# Patient Record
Sex: Male | Born: 1965
Health system: Southern US, Community
[De-identification: ages and names within clinical notes are randomized; demographics above are authoritative.]

## PROBLEM LIST (undated history)

## (undated) DIAGNOSIS — R635 Abnormal weight gain: Secondary | ICD-10-CM

## (undated) DIAGNOSIS — R6882 Decreased libido: Secondary | ICD-10-CM

## (undated) DIAGNOSIS — E785 Hyperlipidemia, unspecified: Secondary | ICD-10-CM

## (undated) DIAGNOSIS — G4733 Obstructive sleep apnea (adult) (pediatric): Secondary | ICD-10-CM

## (undated) DIAGNOSIS — M545 Low back pain, unspecified: Secondary | ICD-10-CM

## (undated) DIAGNOSIS — K219 Gastro-esophageal reflux disease without esophagitis: Secondary | ICD-10-CM

## (undated) DIAGNOSIS — I10 Essential (primary) hypertension: Secondary | ICD-10-CM

## (undated) DIAGNOSIS — R5383 Other fatigue: Secondary | ICD-10-CM

## (undated) DIAGNOSIS — J309 Allergic rhinitis, unspecified: Secondary | ICD-10-CM

## (undated) HISTORY — DX: Abnormal weight gain: R63.5

## (undated) HISTORY — PX: OTHER SURGICAL HISTORY: SHX169

## (undated) HISTORY — DX: Obstructive sleep apnea (adult) (pediatric): G47.33

## (undated) HISTORY — DX: Hyperlipidemia, unspecified: E78.5

## (undated) HISTORY — DX: Allergic rhinitis, unspecified: J30.9

## (undated) HISTORY — DX: Low back pain: M54.5

## (undated) HISTORY — DX: Decreased libido: R68.82

## (undated) HISTORY — DX: Gastro-esophageal reflux disease without esophagitis: K21.9

## (undated) HISTORY — DX: Other fatigue: R53.83

## (undated) HISTORY — DX: Low back pain, unspecified: M54.50

---

## 2004-03-22 ENCOUNTER — Emergency Department (HOSPITAL_COMMUNITY): Admission: EM | Admit: 2004-03-22 | Discharge: 2004-03-22 | Payer: Self-pay | Admitting: Emergency Medicine

## 2006-06-20 ENCOUNTER — Ambulatory Visit (HOSPITAL_BASED_OUTPATIENT_CLINIC_OR_DEPARTMENT_OTHER): Admission: RE | Admit: 2006-06-20 | Discharge: 2006-06-20 | Payer: Self-pay | Admitting: Otolaryngology

## 2006-06-25 ENCOUNTER — Ambulatory Visit: Payer: Self-pay | Admitting: Internal Medicine

## 2008-08-17 ENCOUNTER — Emergency Department (HOSPITAL_COMMUNITY): Admission: EM | Admit: 2008-08-17 | Discharge: 2008-08-17 | Payer: Self-pay | Admitting: Emergency Medicine

## 2010-11-15 LAB — COMPREHENSIVE METABOLIC PANEL
ALT: 18 U/L (ref 0–53)
AST: 17 U/L (ref 0–37)
Albumin: 3.9 g/dL (ref 3.5–5.2)
Alkaline Phosphatase: 58 U/L (ref 39–117)
BUN: 13 mg/dL (ref 6–23)
CO2: 27 mEq/L (ref 19–32)
Calcium: 8.6 mg/dL (ref 8.4–10.5)
Chloride: 100 mEq/L (ref 96–112)
Creatinine, Ser: 1.23 mg/dL (ref 0.4–1.5)
GFR calc Af Amer: >60 mL/min (ref >60)
GFR calc non Af Amer: >60 mL/min (ref >60)
Glucose, Bld: 92 mg/dL (ref 70–99)
Potassium: 3.6 mEq/L (ref 3.5–5.1)
Sodium: 134 mEq/L — ABNORMAL LOW (ref 135–145)
Total Bilirubin: 1.2 mg/dL (ref 0.3–1.2)
Total Protein: 6.6 g/dL (ref 6.0–8.3)

## 2010-11-15 LAB — LIPASE, BLOOD: Lipase: 27 U/L (ref 11–59)

## 2010-11-15 LAB — POCT CARDIAC MARKERS
CKMB, poc: <1 ng/mL — ABNORMAL LOW (ref 1.0–8.0)
CKMB, poc: <1 ng/mL — ABNORMAL LOW (ref 1.0–8.0)
Myoglobin, poc: 126 ng/mL (ref 12–200)
Myoglobin, poc: 193 ng/mL (ref 12–200)
Troponin i, poc: <0.05 ng/mL (ref 0.00–0.09)
Troponin i, poc: <0.05 ng/mL (ref 0.00–0.09)

## 2010-12-17 NOTE — Procedures (Signed)
NAME:  Kyle Jacobson, Kyle Jacobson NO.:  [id number]   MEDICAL RECORD NO.:  [id number]          PATIENT TYPE:  OUT   LOCATION:  SLEEP CENTER                 FACILITY:  [facility]   PHYSICIAN:  Clinton D. Maple Hudson, MD, FCCP, FACPDATE OF BIRTH:  1964-09-03   DATE OF STUDY:                              NOCTURNAL POLYSOMNOGRAM   REFERRING PHYSICIAN:  Dr. Dillard Cannon   INDICATION FOR STUDY:  Hypersomnia with sleep apnea.   EPWORTH SLEEPINESS SCORE:  14/24, BMI 30.7, weight 215 pounds.   MEDICATIONS:  Chantix.   SLEEP ARCHITECTURE:  Total sleep time 387 minutes with sleep efficiency 94%.  Stage I was 5%, stage II 71%, stages III and IV 1%, REM 23% of total sleep  time. Sleep latency 14 minutes, REM latency 15 minutes. Awake after sleep  onset 10 minutes. Arousal index 16.4 which is slightly elevated. No bedtime  medication was taken.   RESPIRATORY DATA:  Split study protocol. Apnea-hypopnea index (AHI, RDI) 38%  obstructive events per hour indicating moderate obstructive sleep  apnea/hypopnea syndrome before CPAP. This included 57 obstructive apneas and  24 hypopneas before CPAP. Events were strongly positional, almost  exclusively recorded while sleeping supine. REM AHI 23.3/hour. CPAP was  titrated successfully to 15 CWP, AHI 0.6/hour. A medium ResMed Ultra Mirage  full face mask was used with a heated humidifier.   OXYGEN DATA:  Moderate snoring with oxygen desaturation to a nadir of 77%  before CPAP. After CPAP control saturation held 97% on room air.   CARDIAC DATA:  Normal sinus rhythm.   MOVEMENT-PARASOMNIA:  Occasional limb jerk with insignificant impact x1.  Bathroom x1.   IMPRESSIONS-RECOMMENDATIONS:  1. Unremarkable sleep architecture for the sleep center environment.  2. Moderate obstructive sleep apnea/hypopnea syndrome, apnea-hypopnea      index 38.3/hour with events positional, mostly noted while sleeping      supine. Moderate snoring with oxygen desaturation  to a nadir of 77%.  3. Successful continuous positive airway pressure titration to 15 cm water      pressure (CWP), apnea-hypopnea index 0.6 per hour. The medium ResMed      Ultra Mirage full face mask was used with a heated humidifier.      Clinton D. Maple Hudson, MD, [facility], FACP  Diplomate, Biomedical engineer of Sleep Medicine  Electronically Signed     CDY/MEDQ  D:  06/25/2006 16:40:19  T:  06/25/2006 17:05:56  Job:  [id number]

## 2014-06-18 ENCOUNTER — Telehealth: Payer: Self-pay | Admitting: Neurology

## 2014-06-18 NOTE — Telephone Encounter (Signed)
Dr. Moreen Fowler referring provider was notified of pt r/s his appt from 06/20/14 to 08/13/13

## 2014-06-20 ENCOUNTER — Ambulatory Visit: Payer: Self-pay | Admitting: Neurology

## 2014-08-12 ENCOUNTER — Encounter: Payer: Self-pay | Admitting: *Deleted

## 2014-08-13 ENCOUNTER — Encounter: Payer: Self-pay | Admitting: Neurology

## 2014-08-13 ENCOUNTER — Ambulatory Visit (INDEPENDENT_AMBULATORY_CARE_PROVIDER_SITE_OTHER): Payer: BLUE CROSS/BLUE SHIELD | Admitting: Neurology

## 2014-08-13 VITALS — BP 124/86 | HR 73 | Ht 70.0 in | Wt 241.2 lb

## 2014-08-13 DIAGNOSIS — R209 Unspecified disturbances of skin sensation: Secondary | ICD-10-CM

## 2014-08-13 NOTE — Progress Notes (Signed)
Note faxed and Epic updated.

## 2014-08-13 NOTE — Patient Instructions (Signed)
1.  Stay well-hydrated 2.  If you develop new numbness or tingling, please call my office to schedule an appointment  3.  Return to clinic as needed

## 2014-08-13 NOTE — Progress Notes (Signed)
[facility] Clinic Note - Initial Visit   Date: 08/13/2014:  Kyle Jacobson MRN: [id number] DOB: [birth year]   Dear Dr:  Moreen Fowler:   Thank you for your kind referral of Kyle Jacobson for consultation of paresthesias of the hands and feet. Although his history is well known to you, please allow [place] to reiterate it for the purpose of our medical record. The patient was accompanied to the clinic by self.     History of Present Illness: Kyle Jacobson is a 49 y.o. right-handed African American male with GERD, OSA, hyperlipidemia, and low back pain presenting for evaluation of discomfort of the hands and feet.  Starting around September 2015, he gradually noticed discomfort of the fingertips.  He has a hard time describing it, but sensation it "doesnt feel right, more soreness".  He denies tingling/numbness of the hands or feet, as if they are asleep.  It occurs 2-3 times per week for about 15 minutes.  He has not noticed any triggers.  No exacerbating factors, but he has noticed that drinking water helps.  He does not have neck discomfort, weakness, or joint pain.    He was seen at urgent care center on 05/01/2014 where he had laboratory testing BMP, TSH free T4, vitamin B12, CBC, and folate which was normal.  He has follow-up visit with his PCP who reassured him that his symptoms did not seem to be due to a worrisome disorder, however patient requested a neurological consultation due to on going nature of his symptoms.  Since October, his symptoms have somewhat improved.    He denies any significant stress which could be contributing to his presentation.  Past Medical History  Diagnosis Date  . GERD (gastroesophageal reflux disease)   . OSA (obstructive sleep apnea)   . Hyperlipidemia   . Low back pain   . Allergic rhinitis   . Fatigue   . Weight gain   . Libido, decreased     Past Surgical History  Procedure Laterality Date  . None       Medications:    No current outpatient prescriptions on file prior to visit.   No current facility-administered medications on file prior to visit.    Allergies: No Known Allergies  Family History: Family History  Problem Relation Age of Onset  . Other Father     unknown  . Healthy Mother   . Hypertension Brother   . Glaucoma Brother   . Cataracts Brother   . Healthy Daughter     x2    Social History: History   Social History  . Marital Status: Single    Spouse Name: N/A    Number of Children: N/A  . Years of Education: N/A   Occupational History  . Not on file.   Social History Main Topics  . Smoking status: Former Smoker -- 0.25 packs/day for 20 years    Types: Cigarettes    Quit date: 08/12/2005  . Smokeless tobacco: Not on file  . Alcohol Use: 0.0 oz/week    0 Not specified per week     Comment: Ocassionally  . Drug Use: No  . Sexual Activity: Not on file   Other Topics Concern  . Not on file   Social History Narrative   Patient is married with 2 children.  He work as a Administrator.     Lives in a 2 story home.     Education: high school.      Review of Systems:  CONSTITUTIONAL: No fevers, chills, night sweats, or weight loss.   EYES: No visual changes or eye pain ENT: No hearing changes.  No history of nose bleeds.   RESPIRATORY: No cough, wheezing and shortness of breath.   CARDIOVASCULAR: Negative for chest pain, and palpitations.   GI: Negative for abdominal discomfort, blood in stools or black stools.  No recent change in bowel habits.   GU:  No history of incontinence.   MUSCLOSKELETAL: No history of joint pain or swelling.  No myalgias.   SKIN: Negative for lesions, rash, and itching.   HEMATOLOGY/ONCOLOGY: Negative for prolonged bleeding, bruising easily, and swollen nodes.  No history of cancer.   ENDOCRINE: Negative for cold or heat intolerance, polydipsia or goiter.   PSYCH:  No depression or anxiety symptoms.   NEURO: As Above.   Vital Signs:  BP  124/86 mmHg  Pulse 73  Ht 5\' 10"  (1.778 m)  Wt 241 lb 4 oz (109.43 kg)  BMI 34.62 kg/m2  SpO2 97% Pain Scale: 0 on a scale of 0-10   General Medical Exam:   General:  Well appearing, comfortable.   Eyes/ENT: see cranial nerve examination.   Neck: No masses appreciated.  Full range of motion without tenderness.  No carotid bruits. Respiratory:  Clear to auscultation, good air entry bilaterally.   Cardiac:  Regular rate and rhythm, no murmur.   Extremities:  No deformities, edema, or skin discoloration.  Skin:  No rashes or lesions.  Neurological Exam: MENTAL STATUS including orientation to time, place, person, recent and remote memory, attention span and concentration, language, and fund of knowledge is normal.  Speech is not dysarthric.  CRANIAL NERVES: II:  No visual field defects.  Unremarkable fundi.   III-IV-VI: Pupils equal round and reactive to light.  Normal conjugate, extra-ocular eye movements in all directions of gaze.  No nystagmus.  No ptosis.   V:  Normal facial sensation.     VII:  Normal facial symmetry and movements.  No pathologic facial reflexes.  VIII:  Normal hearing and vestibular function.   IX-X:  Normal palatal movement.   XI:  Normal shoulder shrug and head rotation.   XII:  Normal tongue strength and range of motion, no deviation or fasciculation.  MOTOR:  No atrophy, fasciculations or abnormal movements.  No pronator drift.  Tone is normal.    Right Upper Extremity:    Left Upper Extremity:    Deltoid  5/5   Deltoid  5/5   Biceps  5/5   Biceps  5/5   Triceps  5/5   Triceps  5/5   Wrist extensors  5/5   Wrist extensors  5/5   Wrist flexors  5/5   Wrist flexors  5/5   Finger extensors  5/5   Finger extensors  5/5   Finger flexors  5/5   Finger flexors  5/5   Dorsal interossei  5/5   Dorsal interossei  5/5   Abductor pollicis  5/5   Abductor pollicis  5/5   Tone (Ashworth scale)  0  Tone (Ashworth scale)  0   Right Lower Extremity:    Left Lower  Extremity:    Hip flexors  5/5   Hip flexors  5/5   Hip extensors  5/5   Hip extensors  5/5   Knee flexors  5/5   Knee flexors  5/5   Knee extensors  5/5   Knee extensors  5/5   Dorsiflexors  5/5   Dorsiflexors  5/5   Plantarflexors  5/5   Plantarflexors  5/5   Toe extensors  5/5   Toe extensors  5/5   Toe flexors  5/5   Toe flexors  5/5   Tone (Ashworth scale)  0  Tone (Ashworth scale)  0   MSRs:  Right                                                                 Left brachioradialis 2+  brachioradialis 2+  biceps 2+  biceps 2+  triceps 2+  triceps 2+  patellar 2+  patellar 2+  ankle jerk 2+  ankle jerk 2+  Hoffman no  Hoffman no  plantar response down  plantar response down   SENSORY:  Normal and symmetric perception of light touch, pinprick, vibration, and proprioception.  Romberg's sign absent.   COORDINATION/GAIT: Normal finger-to- nose-finger and heel-to-shin.  Intact rapid alternating movements bilaterally.  Able to rise from a chair without using arms.  Gait narrow based and stable. Tandem and stressed gait intact.    IMPRESSION/PLAN: Mr. Holzhauer is a 49 year-old gentleman presenting for evaluation of intermittent soreness involving the toes and fingertips. His exam is non-focal and non-lateralizing.  I counseled the patient that with his normal exam and absence of numbness/tingling, my suspicion for a worrisome neurological condition is very low.  If his symptoms worsen, electrodiagnostic testing can be performed, but at this point, it is too premature and would most likely return normal.  Since symptoms are improved with hydration, I encouraged him to stay well hydrated. No further testing indicated at this time. Return to clinic as needed.   The duration of this appointment visit was 30 minutes of face-to-face time with the patient.  Greater than 50% of this time was spent in counseling, explanation of diagnosis, planning of further management, and coordination of  care.   Thank you for allowing me to participate in patient's care.  If I can answer any additional questions, I would be pleased to do so.    Sincerely,    Donika K. Posey Pronto, DO

## 2015-04-24 ENCOUNTER — Other Ambulatory Visit: Payer: Self-pay | Admitting: Family Medicine

## 2015-04-24 DIAGNOSIS — N50819 Testicular pain, unspecified: Secondary | ICD-10-CM

## 2015-04-27 ENCOUNTER — Ambulatory Visit
Admission: RE | Admit: 2015-04-27 | Discharge: 2015-04-27 | Disposition: A | Discharge status: Home / Self Care | Payer: BLUE CROSS/BLUE SHIELD | Source: Ambulatory Visit | Attending: Family Medicine | Admitting: Family Medicine

## 2015-04-27 DIAGNOSIS — N50819 Testicular pain, unspecified: Secondary | ICD-10-CM

## 2015-09-01 ENCOUNTER — Other Ambulatory Visit: Payer: Self-pay | Admitting: Family Medicine

## 2015-09-01 ENCOUNTER — Ambulatory Visit
Admission: RE | Admit: 2015-09-01 | Discharge: 2015-09-01 | Disposition: A | Discharge status: Home / Self Care | Payer: BLUE CROSS/BLUE SHIELD | Source: Ambulatory Visit | Attending: Family Medicine | Admitting: Family Medicine

## 2015-09-01 DIAGNOSIS — M25461 Effusion, right knee: Secondary | ICD-10-CM

## 2015-09-15 ENCOUNTER — Emergency Department
Admission: EM | Admit: 2015-09-15 | Discharge: 2015-09-15 | Disposition: A | Discharge status: Home / Self Care | Payer: BLUE CROSS/BLUE SHIELD | Source: Home / Self Care | Attending: Family Medicine | Admitting: Family Medicine

## 2015-09-15 ENCOUNTER — Encounter: Payer: Self-pay | Admitting: *Deleted

## 2015-09-15 DIAGNOSIS — R198 Other specified symptoms and signs involving the digestive system and abdomen: Secondary | ICD-10-CM

## 2015-09-15 MED ORDER — ESOMEPRAZOLE MAGNESIUM 40 MG PO PACK: 40.0000 mg | PACK | Freq: Every day | ORAL | Status: DC

## 2015-09-15 NOTE — ED Notes (Signed)
Pt c/o 3 days of intermittent epigastric pain. Pain is relieved by eating and drinking. Taken pepto otc. H/o reflux and gastric ulcer.

## 2015-09-15 NOTE — Discharge Instructions (Signed)

## 2015-09-15 NOTE — ED Provider Notes (Signed)
CSN: [id number]     Arrival date & time 09/15/15  1325 History   First MD Initiated Contact with Patient 09/15/15 1352     Chief Complaint  Patient presents with  . Abdominal Pain      HPI Comments: Patient complains of three day history of intermittent burning epigastric pain.  The pain is relieved by eating/drinking, and recurs about 2 hours after eating.  He has had nausea without vomiting.  He states that he had ulcers at age 50, and was diagnosed with reflux about 2 years ago.  He had similar symptoms about 10 years ago that resolved with Nexium.  Patient is a 50 y.o. male presenting with abdominal pain. The history is provided by the patient.  Abdominal Pain Pain location:  Epigastric Pain quality: burning and gnawing   Pain radiates to:  Does not radiate Pain severity:  Mild Onset quality:  Gradual Duration:  3 days Timing:  Intermittent Progression:  Worsening Chronicity:  Recurrent Context: eating   Context: not awakening from sleep and not diet changes   Relieved by: Pepto Bismol. Exacerbated by: not eating. Ineffective treatments:  None tried Associated symptoms: nausea   Associated symptoms: no anorexia, no belching, no chest pain, no constipation, no diarrhea, no fatigue, no fever, no hematemesis, no hematochezia, no melena and no vomiting     Past Medical History  Diagnosis Date  . GERD (gastroesophageal reflux disease)   . OSA (obstructive sleep apnea)   . Hyperlipidemia   . Low back pain   . Allergic rhinitis   . Fatigue   . Weight gain   . Libido, decreased    Past Surgical History  Procedure Laterality Date  . None     Family History  Problem Relation Age of Onset  . Other Father     unknown  . Healthy Mother   . Hypertension Brother   . Glaucoma Brother   . Cataracts Brother   . Healthy Daughter     x2   Social History  Substance Use Topics  . Smoking status: Former Smoker -- 0.25 packs/day for 20 years    Types: Cigarettes    Quit date:  08/12/2005  . Smokeless tobacco: Never Used  . Alcohol Use: 0.0 oz/week    0 Standard drinks or equivalent per week     Comment: Ocassionally    Review of Systems  Constitutional: Negative for fever and fatigue.  Cardiovascular: Negative for chest pain.  Gastrointestinal: Positive for nausea and abdominal pain. Negative for vomiting, diarrhea, constipation, melena, hematochezia, anorexia and hematemesis.  All other systems reviewed and are negative.   Allergies  Review of patient's allergies indicates no known allergies.  Home Medications   Prior to Admission medications   Medication Sig Start Date End Date Taking? Authorizing Provider  meloxicam (MOBIC) 15 MG tablet Take 15 mg by mouth daily.   Yes Historical Provider, MD  esomeprazole (NEXIUM) 40 MG packet Take 40 mg by mouth daily before breakfast. 09/15/15   Kandra Nicolas, MD   Meds Ordered and Administered this Visit  Medications - No data to display  BP 130/89 mmHg  Pulse 64  Temp(Src) 98.5 F (36.9 C) (Oral)  Resp 14  Ht 5\' 10"  (1.778 m)  Wt 238 lb (107.956 kg)  BMI 34.15 kg/m2  SpO2 99% No data found.   Physical Exam Nursing notes and Vital Signs reviewed. Appearance:  Patient appears stated age, and in no acute distress Eyes:  Pupils are equal, round,  and reactive to light and accomodation.  Extraocular movement is intact.  Conjunctivae are not inflamed  Nose:  Normal Pharynx:  Normal Neck:  Supple.  No adenopathy Lungs:  Clear to auscultation.  Breath sounds are equal.  Moving air well. Heart:  Regular rate and rhythm without murmurs, rubs, or gallops.  Abdomen:  Nontender without masses or hepatosplenomegaly.  Bowel sounds are present.  No CVA or flank tenderness.  Extremities:  No edema.  Skin:  No rash present.   ED Course  Procedures  None    MDM   1. Peptic ulcer symptoms    Begin Nexium 40mg  daily. Followup with Family Doctor in about two weeks.    Kandra Nicolas, MD 09/21/15 [phone]

## 2015-12-14 DIAGNOSIS — M25579 Pain in unspecified ankle and joints of unspecified foot: Secondary | ICD-10-CM | POA: Diagnosis not present

## 2016-03-25 DIAGNOSIS — M25579 Pain in unspecified ankle and joints of unspecified foot: Secondary | ICD-10-CM | POA: Diagnosis not present

## 2016-03-25 DIAGNOSIS — Z125 Encounter for screening for malignant neoplasm of prostate: Secondary | ICD-10-CM | POA: Diagnosis not present

## 2016-03-25 DIAGNOSIS — Z0000 Encounter for general adult medical examination without abnormal findings: Secondary | ICD-10-CM | POA: Diagnosis not present

## 2016-03-25 DIAGNOSIS — E785 Hyperlipidemia, unspecified: Secondary | ICD-10-CM | POA: Diagnosis not present

## 2016-03-25 DIAGNOSIS — K219 Gastro-esophageal reflux disease without esophagitis: Secondary | ICD-10-CM | POA: Diagnosis not present

## 2016-03-25 DIAGNOSIS — I10 Essential (primary) hypertension: Secondary | ICD-10-CM | POA: Diagnosis not present

## 2016-03-30 DIAGNOSIS — M25471 Effusion, right ankle: Secondary | ICD-10-CM | POA: Diagnosis not present

## 2016-03-30 DIAGNOSIS — M25571 Pain in right ankle and joints of right foot: Secondary | ICD-10-CM | POA: Diagnosis not present

## 2016-03-30 DIAGNOSIS — M25472 Effusion, left ankle: Secondary | ICD-10-CM | POA: Diagnosis not present

## 2016-05-27 DIAGNOSIS — Z1211 Encounter for screening for malignant neoplasm of colon: Secondary | ICD-10-CM | POA: Diagnosis not present

## 2016-05-27 DIAGNOSIS — K649 Unspecified hemorrhoids: Secondary | ICD-10-CM | POA: Diagnosis not present

## 2016-05-27 DIAGNOSIS — K648 Other hemorrhoids: Secondary | ICD-10-CM | POA: Diagnosis not present

## 2016-05-27 DIAGNOSIS — Z800 Family history of malignant neoplasm of digestive organs: Secondary | ICD-10-CM | POA: Diagnosis not present

## 2016-05-27 DIAGNOSIS — Z8371 Family history of colonic polyps: Secondary | ICD-10-CM | POA: Diagnosis not present

## 2016-12-05 DIAGNOSIS — M654 Radial styloid tenosynovitis [de Quervain]: Secondary | ICD-10-CM | POA: Diagnosis not present

## 2017-01-05 DIAGNOSIS — M654 Radial styloid tenosynovitis [de Quervain]: Secondary | ICD-10-CM | POA: Diagnosis not present

## 2017-03-02 DIAGNOSIS — K644 Residual hemorrhoidal skin tags: Secondary | ICD-10-CM | POA: Diagnosis not present

## 2017-03-30 DIAGNOSIS — L309 Dermatitis, unspecified: Secondary | ICD-10-CM | POA: Diagnosis not present

## 2017-08-03 DIAGNOSIS — N529 Male erectile dysfunction, unspecified: Secondary | ICD-10-CM | POA: Diagnosis not present

## 2017-08-03 DIAGNOSIS — Z0000 Encounter for general adult medical examination without abnormal findings: Secondary | ICD-10-CM | POA: Diagnosis not present

## 2017-08-03 DIAGNOSIS — I10 Essential (primary) hypertension: Secondary | ICD-10-CM | POA: Diagnosis not present

## 2017-08-03 DIAGNOSIS — E7800 Pure hypercholesterolemia, unspecified: Secondary | ICD-10-CM | POA: Diagnosis not present

## 2017-08-03 DIAGNOSIS — Z125 Encounter for screening for malignant neoplasm of prostate: Secondary | ICD-10-CM | POA: Diagnosis not present

## 2017-08-03 DIAGNOSIS — R5383 Other fatigue: Secondary | ICD-10-CM | POA: Diagnosis not present

## 2017-09-05 DIAGNOSIS — R748 Abnormal levels of other serum enzymes: Secondary | ICD-10-CM | POA: Diagnosis not present

## 2018-05-28 DIAGNOSIS — Z7721 Contact with and (suspected) exposure to potentially hazardous body fluids: Secondary | ICD-10-CM | POA: Diagnosis not present

## 2018-12-19 DIAGNOSIS — Z7251 High risk heterosexual behavior: Secondary | ICD-10-CM | POA: Diagnosis not present

## 2019-04-19 DIAGNOSIS — R309 Painful micturition, unspecified: Secondary | ICD-10-CM | POA: Diagnosis not present

## 2019-04-19 DIAGNOSIS — R319 Hematuria, unspecified: Secondary | ICD-10-CM | POA: Diagnosis not present

## 2019-04-19 DIAGNOSIS — I10 Essential (primary) hypertension: Secondary | ICD-10-CM | POA: Diagnosis not present

## 2019-05-09 DIAGNOSIS — R3121 Asymptomatic microscopic hematuria: Secondary | ICD-10-CM | POA: Diagnosis not present

## 2019-05-09 DIAGNOSIS — R300 Dysuria: Secondary | ICD-10-CM | POA: Diagnosis not present

## 2019-05-09 DIAGNOSIS — R8271 Bacteriuria: Secondary | ICD-10-CM | POA: Diagnosis not present

## 2019-05-27 DIAGNOSIS — N529 Male erectile dysfunction, unspecified: Secondary | ICD-10-CM | POA: Diagnosis not present

## 2019-05-27 DIAGNOSIS — Z0000 Encounter for general adult medical examination without abnormal findings: Secondary | ICD-10-CM | POA: Diagnosis not present

## 2019-05-27 DIAGNOSIS — I10 Essential (primary) hypertension: Secondary | ICD-10-CM | POA: Diagnosis not present

## 2019-07-05 DIAGNOSIS — I10 Essential (primary) hypertension: Secondary | ICD-10-CM | POA: Diagnosis not present

## 2019-07-05 DIAGNOSIS — Z0000 Encounter for general adult medical examination without abnormal findings: Secondary | ICD-10-CM | POA: Diagnosis not present

## 2019-07-05 DIAGNOSIS — Z125 Encounter for screening for malignant neoplasm of prostate: Secondary | ICD-10-CM | POA: Diagnosis not present

## 2019-07-05 DIAGNOSIS — E785 Hyperlipidemia, unspecified: Secondary | ICD-10-CM | POA: Diagnosis not present

## 2019-11-25 ENCOUNTER — Emergency Department (HOSPITAL_COMMUNITY): Payer: BC Managed Care – PPO

## 2019-11-25 ENCOUNTER — Encounter (HOSPITAL_COMMUNITY): Payer: Self-pay | Admitting: *Deleted

## 2019-11-25 ENCOUNTER — Emergency Department (HOSPITAL_COMMUNITY)
Admission: EM | Admit: 2019-11-25 | Discharge: 2019-11-26 | Discharge status: Against Medical Advice | Payer: BC Managed Care – PPO | Attending: Emergency Medicine | Admitting: Emergency Medicine

## 2019-11-25 ENCOUNTER — Other Ambulatory Visit: Payer: Self-pay

## 2019-11-25 DIAGNOSIS — R0789 Other chest pain: Secondary | ICD-10-CM | POA: Diagnosis not present

## 2019-11-25 DIAGNOSIS — Z5321 Procedure and treatment not carried out due to patient leaving prior to being seen by health care provider: Secondary | ICD-10-CM | POA: Diagnosis not present

## 2019-11-25 DIAGNOSIS — R079 Chest pain, unspecified: Secondary | ICD-10-CM | POA: Diagnosis not present

## 2019-11-25 LAB — BASIC METABOLIC PANEL
Anion gap: 13 (ref 5–15)
BUN: 17 mg/dL (ref 6–20)
CO2: 25 mmol/L (ref 22–32)
Calcium: 9.2 mg/dL (ref 8.9–10.3)
Chloride: 101 mmol/L (ref 98–111)
Creatinine, Ser: 1.49 mg/dL — ABNORMAL HIGH (ref 0.61–1.24)
GFR calc Af Amer: >60 mL/min (ref >60)
GFR calc non Af Amer: 52 mL/min — ABNORMAL LOW (ref >60)
Glucose, Bld: 116 mg/dL — ABNORMAL HIGH (ref 70–99)
Potassium: 3.2 mmol/L — ABNORMAL LOW (ref 3.5–5.1)
Sodium: 139 mmol/L (ref 135–145)

## 2019-11-25 LAB — TROPONIN I (HIGH SENSITIVITY)
Troponin I (High Sensitivity): 3 ng/L (ref <18)
Troponin I (High Sensitivity): <2 ng/L (ref <18)

## 2019-11-25 LAB — CBC
HCT: 42 % (ref 39.0–52.0)
Hemoglobin: 14.7 g/dL (ref 13.0–17.0)
MCH: 29.3 pg (ref 26.0–34.0)
MCHC: 35 g/dL (ref 30.0–36.0)
MCV: 83.8 fL (ref 80.0–100.0)
Platelets: 241 10*3/uL (ref 150–400)
RBC: 5.01 MIL/uL (ref 4.22–5.81)
RDW: 12.4 % (ref 11.5–15.5)
WBC: 4.8 10*3/uL (ref 4.0–10.5)
nRBC: 0 % (ref 0.0–0.2)

## 2019-11-25 MED ORDER — SODIUM CHLORIDE 0.9% FLUSH: 3.0000 mL | Freq: Once | INTRAVENOUS | Status: DC

## 2019-11-25 NOTE — ED Triage Notes (Signed)
To ED for eval of upper right abd pain and cp since Saturday. States pain is worse when he stands up. Vomiting last week but resolved. Pain is minimal at the present. Asked if pain would become worse if pt walks across the room or if he eats. He states no - there is nothing in particular to make pain worse or better.

## 2019-11-26 NOTE — ED Notes (Signed)
Pt said he will be leaving because of the wait

## 2019-11-29 DIAGNOSIS — K219 Gastro-esophageal reflux disease without esophagitis: Secondary | ICD-10-CM | POA: Diagnosis not present

## 2019-11-29 DIAGNOSIS — I10 Essential (primary) hypertension: Secondary | ICD-10-CM | POA: Diagnosis not present

## 2019-11-29 DIAGNOSIS — G4733 Obstructive sleep apnea (adult) (pediatric): Secondary | ICD-10-CM | POA: Diagnosis not present

## 2019-11-29 DIAGNOSIS — E7800 Pure hypercholesterolemia, unspecified: Secondary | ICD-10-CM | POA: Diagnosis not present

## 2019-11-30 DIAGNOSIS — Z113 Encounter for screening for infections with a predominantly sexual mode of transmission: Secondary | ICD-10-CM | POA: Diagnosis not present

## 2020-04-08 DIAGNOSIS — R0789 Other chest pain: Secondary | ICD-10-CM | POA: Diagnosis not present

## 2020-04-08 DIAGNOSIS — I10 Essential (primary) hypertension: Secondary | ICD-10-CM | POA: Diagnosis not present

## 2020-04-24 DIAGNOSIS — K2100 Gastro-esophageal reflux disease with esophagitis, without bleeding: Secondary | ICD-10-CM | POA: Diagnosis not present

## 2020-04-24 DIAGNOSIS — R0789 Other chest pain: Secondary | ICD-10-CM | POA: Diagnosis not present

## 2020-04-24 DIAGNOSIS — I10 Essential (primary) hypertension: Secondary | ICD-10-CM | POA: Diagnosis not present

## 2020-05-15 DIAGNOSIS — I10 Essential (primary) hypertension: Secondary | ICD-10-CM | POA: Diagnosis not present

## 2020-05-15 DIAGNOSIS — K2100 Gastro-esophageal reflux disease with esophagitis, without bleeding: Secondary | ICD-10-CM | POA: Diagnosis not present

## 2020-05-15 DIAGNOSIS — R0789 Other chest pain: Secondary | ICD-10-CM | POA: Diagnosis not present

## 2020-05-15 DIAGNOSIS — R079 Chest pain, unspecified: Secondary | ICD-10-CM | POA: Diagnosis not present

## 2020-05-19 ENCOUNTER — Other Ambulatory Visit: Payer: Self-pay | Admitting: Family Medicine

## 2020-05-19 DIAGNOSIS — R0789 Other chest pain: Secondary | ICD-10-CM

## 2020-05-21 ENCOUNTER — Telehealth: Payer: Self-pay

## 2020-05-21 NOTE — Telephone Encounter (Signed)
NOTES ON FILE FROM  [facility] [phone] SENT REFERRAL TO SCHEDULING

## 2020-05-29 ENCOUNTER — Other Ambulatory Visit: Payer: BC Managed Care – PPO

## 2020-06-05 ENCOUNTER — Ambulatory Visit
Admission: RE | Admit: 2020-06-05 | Discharge: 2020-06-05 | Disposition: A | Discharge status: Home / Self Care | Payer: BC Managed Care – PPO | Source: Ambulatory Visit | Attending: Family Medicine | Admitting: Family Medicine

## 2020-06-05 DIAGNOSIS — R0789 Other chest pain: Secondary | ICD-10-CM

## 2020-06-05 DIAGNOSIS — E041 Nontoxic single thyroid nodule: Secondary | ICD-10-CM | POA: Diagnosis not present

## 2020-06-11 ENCOUNTER — Other Ambulatory Visit: Payer: Self-pay

## 2020-06-11 ENCOUNTER — Ambulatory Visit (INDEPENDENT_AMBULATORY_CARE_PROVIDER_SITE_OTHER): Payer: BC Managed Care – PPO

## 2020-06-11 ENCOUNTER — Encounter: Payer: Self-pay | Admitting: Internal Medicine

## 2020-06-11 ENCOUNTER — Ambulatory Visit: Payer: BC Managed Care – PPO | Admitting: Podiatry

## 2020-06-11 ENCOUNTER — Encounter: Payer: Self-pay | Admitting: Podiatry

## 2020-06-11 ENCOUNTER — Ambulatory Visit: Payer: BC Managed Care – PPO | Admitting: Internal Medicine

## 2020-06-11 VITALS — BP 120/84 | HR 91 | Ht 70.0 in | Wt 237.0 lb

## 2020-06-11 VITALS — BP 135/80 | HR 76 | Temp 98.0°F

## 2020-06-11 DIAGNOSIS — M76821 Posterior tibial tendinitis, right leg: Secondary | ICD-10-CM

## 2020-06-11 DIAGNOSIS — E785 Hyperlipidemia, unspecified: Secondary | ICD-10-CM | POA: Diagnosis not present

## 2020-06-11 DIAGNOSIS — R079 Chest pain, unspecified: Secondary | ICD-10-CM

## 2020-06-11 DIAGNOSIS — I10 Essential (primary) hypertension: Secondary | ICD-10-CM | POA: Diagnosis not present

## 2020-06-11 DIAGNOSIS — S99921A Unspecified injury of right foot, initial encounter: Secondary | ICD-10-CM

## 2020-06-11 DIAGNOSIS — R072 Precordial pain: Secondary | ICD-10-CM | POA: Diagnosis not present

## 2020-06-11 MED ORDER — ATORVASTATIN CALCIUM 10 MG PO TABS: 10.0000 mg | ORAL_TABLET | Freq: Every day | ORAL | 3 refills | Status: DC

## 2020-06-11 MED ORDER — METOPROLOL TARTRATE 100 MG PO TABS: ORAL_TABLET | ORAL | 0 refills | Status: AC

## 2020-06-11 NOTE — Progress Notes (Signed)
Cardiology Office Note:    Date:  06/11/2020   ID:  Kyle Jacobson, DOB April 26, 1966, MRN [id number]  PCP:  Antony Contras, MD  [facility] Cardiologist:  No primary care provider on file.  [facility] Electrophysiologist:  None   CC: Chest Pain Consulted for the evaluation of Chest Pain at the behest of Antony Contras, MD   History of Present Illness:    Kyle Jacobson is a 54 y.o. male with a hx of OSA on CPAP, Former Tobacco Use, prior transaminitis (resolved in 2020). HTN, HLD prior CP r/o visit 11/26/19 with normal novel troponins who presents for evaluation.  Saw PCP 05/2020 for chest pain, worse with stopping his PPI.  But also with chest pressure.  Patient notes that he started having chest pain chronically for 15 years.  Have a LHC 15 years prior with no blockages.  Lately has been having new chest discomfort.  Has two types of chest issue.  One is dullness that worsens with exertion, and moving his arm.  Feels better with position.  Patient also notes chest pressure it completely resolves.  No shortness of breath, no DOE, no PND, no orthopnea.  Patient was able to bring some wood in for his cousin and felt fatigue and back pain, but no chest pain.  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  Past Medical History:  Diagnosis Date  . Allergic rhinitis   . Fatigue   . GERD (gastroesophageal reflux disease)   . Hyperlipidemia   . Libido, decreased   . Low back pain   . OSA (obstructive sleep apnea)   . Weight gain     Past Surgical History:  Procedure Laterality Date  . None     Current Medications: Current Meds  Medication Sig  . amLODipine (NORVASC) 5 MG tablet Take 5 mg by mouth daily.  . diclofenac Sodium (VOLTAREN) 1 % GEL Apply 1 application topically daily as needed.    Allergies:   Patient has no known allergies.   Social History   Socioeconomic History  . Marital status: Married    Spouse name: Not on file  . Number of children: Not on file  .  Years of education: Not on file  . Highest education level: Not on file  Occupational History  . Not on file  Tobacco Use  . Smoking status: Former Smoker    Packs/day: 0.25    Years: 20.00    Pack years: 5.00    Types: Cigarettes    Quit date: 08/12/2005    Years since quitting: 14.8  . Smokeless tobacco: Never Used  Substance and Sexual Activity  . Alcohol use: Yes    Alcohol/week: 0.0 standard drinks    Comment: Ocassionally  . Drug use: No  . Sexual activity: Not on file  Other Topics Concern  . Not on file  Social History Narrative   Patient is married with 2 children.  He work as a Administrator.     Lives in a 2 story home.     Education: high school.     Social Determinants of Health   Financial Resource Strain:   . Difficulty of Paying Living Expenses: Not on file  Food Insecurity:   . Worried About Charity fundraiser in the Last Year: Not on file  . Ran Out of Food in the Last Year: Not on file  Transportation Needs:   . Lack of Transportation (Medical): Not on file  . Lack  of Transportation (Non-Medical): Not on file  Physical Activity:   . Days of Exercise per Week: Not on file  . Minutes of Exercise per Session: Not on file  Stress:   . Feeling of Stress : Not on file  Social Connections:   . Frequency of Communication with Friends and Family: Not on file  . Frequency of Social Gatherings with Friends and Family: Not on file  . Attends Religious Services: Not on file  . Active Member of Clubs or Organizations: Not on file  . Attends Archivist Meetings: Not on file  . Marital Status: Not on file    Family History: The patient's family history includes Cataracts in his brother; Glaucoma in his brother; Healthy in his daughter and mother; Hypertension in his brother; Other in his father.  Biologically father may have had MI; does not know much about this side of the family  ROS:   Please see the history of present illness.    All other  systems reviewed and are negative.  EKGs/Labs/Other Studies Reviewed:    The following studies were reviewed today:  EKG:  EKG is ordered today.  The ekg ordered today demonstrates SR 91 no ST/ T changes 11/26/19 : Sinus 80 borderline ant infarct pattern  Recent Labs: 11/25/2019: BUN 17; Creatinine, Ser 1.49; Hemoglobin 14.7; Platelets 241; Potassium 3.2; Sodium 139  Recent Lipid Panel No results found for: CHOL, TRIG, HDL, CHOLHDL, VLDL, LDLCALC, LDLDIRECT  OSH labs Cholesterol 227 HDl 49 LDL149 TG 159  Physical Exam:    VS:  BP 120/84   Pulse 91   Ht 5' 10"  (1.778 m)   Wt 237 lb (107.5 kg)   SpO2 96%   BMI 34.01 kg/m     Wt Readings from Last 3 Encounters:  06/11/20 237 lb (107.5 kg)  11/25/19 235 lb (106.6 kg)  09/15/15 238 lb (108 kg)    GEN: Well nourished, well developed in no acute distress HEENT: Normal NECK: No JVD; No carotid bruits LYMPHATICS: No lymphadenopathy CARDIAC: RRR, no murmurs, rubs, gallops RESPIRATORY:  Clear to auscultation without rales, wheezing or rhonchi  ABDOMEN: Soft, non-tender, non-distended MUSCULOSKELETAL:  No edema; No deformity  SKIN: Warm and dry NEUROLOGIC:  Alert and oriented x 3 PSYCHIATRIC:  Normal affect   ASSESSMENT:    1. Chest pain of uncertain etiology   2. Essential hypertension   3. Hyperlipidemia, unspecified hyperlipidemia type   4. Precordial pain    PLAN:    In order of problems listed above:  Chest Pain HTN HLD ASCVD Risk 11.8 % (slightly higher with FHx) - The patient presents with possibly cardiac symptoms. - continue norvasc for HTN - will start atorvastatin 10 mg and check lipids and LFTs in three months. - Would recommend CCTA +/-FFR to exclude obstructive CAD  - CMP today - if positive, discussed risks and benefits of cardiac catheterization have been discussed with the patient.  These include bleeding, infection, kidney damage, stroke, heart attack, death.  The patient understands these risks  and is willing to proceed if necessary  6 follow up unless new symptoms or abnormal test results warranting change in plan Would be reasonable for Virtual Follow up Would be reasonable for APP Follow up  Shared Decision Making/Informed Consent      Medication Adjustments/Labs and Tests Ordered: Current medicines are reviewed at length with the patient today.  Concerns regarding medicines are outlined above.  Orders Placed This Encounter  Procedures  . CT CORONARY MORPH  W/CTA COR W/SCORE W/CA W/CM &/OR WO/CM  . CT CORONARY FRACTIONAL FLOW RESERVE DATA PREP  . CT CORONARY FRACTIONAL FLOW RESERVE FLUID ANALYSIS  . Lipid panel  . Hepatic function panel  . Basic metabolic panel  . EKG 12-Lead   Meds ordered this encounter  Medications  . atorvastatin (LIPITOR) 10 MG tablet    Sig: Take 1 tablet (10 mg total) by mouth daily.    Dispense:  90 tablet    Refill:  3  . metoprolol tartrate (LOPRESSOR) 100 MG tablet    Sig: Take one tablet by mouth 2 hours prior to CT    Dispense:  1 tablet    Refill:  0    Patient Instructions  Medication Instructions:  1) START Atorvastatin 50m once daily  *If you need a refill on your cardiac medications before your next appointment, please call your pharmacy*   Lab Work: BMET today  Lipid and Liver in 3 months.  You will need to be fasting for these labs (nothing to eat or drink after midnight except water and black coffee).  If you have labs (blood work) drawn today and your tests are completely normal, you will receive your results only by: .Marland KitchenMyChart Message (if you have MyChart) OR . A paper copy in the mail If you have any lab test that is abnormal or we need to change your treatment, we will call you to review the results.   Testing/Procedures: Your physician recommends that you have a Coronary CT performed.    Follow-Up: [facility] you and your health needs are our priority.  As part of our continuing mission to provide  you with exceptional heart care, we have created designated Provider Care Teams.  These Care Teams include your primary Cardiologist (physician) and Advanced Practice Providers (APPs -  Physician Assistants and Nurse Practitioners) who all work together to provide you with the care you need, when you need it.  We recommend signing up for the patient portal called "MyChart".  Sign up information is provided on this After Visit Summary.  MyChart is used to connect with patients for Virtual Visits (Telemedicine).  Patients are able to view lab/test results, encounter notes, upcoming appointments, etc.  Non-urgent messages can be sent to your provider as well.   To learn more about what you can do with MyChart, go to [email or web address]    Your next appointment:   6 month(s)  The format for your next appointment:   In Person  Provider:   You may see MRudean Haskell MD or one of the following Advanced Practice Providers on your designated Care Team:    DMelina Copa PA-C  MErmalinda Barrios PA-C    Other Instructions  Your cardiac CT will be scheduled at one of the below locations:   [facility] [phone] [place]SVersa[place]  [phone] [facility] please [facility] at the [facility] minutes prior to test start time. Proceed to the [facility] (first floor) to check-in and test prep.  [facility] please arrive 15 mins early for check-in and test prep.  Please follow these instructions carefully (unless otherwise directed):  Hold all erectile dysfunction medications at least 3 days (72 hrs) prior to test.  On the Night Before the  Test: . Be sure to Drink plenty of water. . Do not consume any  caffeinated/decaffeinated beverages or chocolate 12 hours prior to your test. . Do not take any antihistamines 12 hours prior to your test.  On the Day of the Test: . Drink plenty of water. Do not drink any water within one hour of the test. . Do not eat any food 4 hours prior to the test. . You may take your regular medications prior to the test.  . Take metoprolol (Lopressor) two hours prior to test. . HOLD Furosemide/Hydrochlorothiazide morning of the test.       After the Test: . Drink plenty of water. . After receiving IV contrast, you may experience a mild flushed feeling. This is normal. . On occasion, you may experience a mild rash up to 24 hours after the test. This is not dangerous. If this occurs, you can take Benadryl 25 mg and increase your fluid intake. . If you experience trouble breathing, this can be serious. If it is severe call 911 IMMEDIATELY. If it is mild, please call our office. . If you take any of these medications: Glipizide/Metformin, Avandament, Glucavance, please do not take 48 hours after completing test unless otherwise instructed.   Once we have confirmed authorization from your insurance company, we will call you to set up a date and time for your test. Based on how quickly your insurance processes prior authorizations requests, please allow up to 4 weeks to be contacted for scheduling your Cardiac CT appointment. Be advised that routine Cardiac CT appointments could be scheduled as many as 8 weeks after your provider has ordered it.  For non-scheduling related questions, please contact the cardiac imaging nurse navigator should you have any questions/concerns: Marchia Bond, Cardiac Imaging Nurse Navigator Burley Saver, Interim Cardiac Imaging Nurse [place] and V[facility] Services Direct Office Dial: [phone]   For scheduling needs, including cancellations and rescheduling, please call [place], [phone] (temporary number).         Signed, Werner Lean, MD  06/11/2020 3:31 PM    [place]

## 2020-06-11 NOTE — Patient Instructions (Addendum)
Medication Instructions:  1) START Atorvastatin 59m once daily  *If you need a refill on your cardiac medications before your next appointment, please call your pharmacy*   Lab Work: BMET today  Lipid and Liver in 3 months.  You will need to be fasting for these labs (nothing to eat or drink after midnight except water and black coffee).  If you have labs (blood work) drawn today and your tests are completely normal, you will receive your results only by: .Marland KitchenMyChart Message (if you have MyChart) OR . A paper copy in the mail If you have any lab test that is abnormal or we need to change your treatment, we will call you to review the results.   Testing/Procedures: Your physician recommends that you have a Coronary CT performed.    Follow-Up: [facility] and your health needs are our priority.  As part of our continuing mission to provide you with exceptional heart care, we have created designated Provider Care Teams.  These Care Teams include your primary Cardiologist (physician) and Advanced Practice Providers (APPs -  Physician Assistants and Nurse Practitioners) who all work together to provide you with the care you need, when you need it.  We recommend signing up for the patient portal called "MyChart".  Sign up information is provided on this After Visit Summary.  MyChart is used to connect with patients for Virtual Visits (Telemedicine).  Patients are able to view lab/test results, encounter notes, upcoming appointments, etc.  Non-urgent messages can be sent to your provider as well.   To learn more about what you can do with MyChart, go to [email or web address]    Your next appointment:   6 month(s)  The format for your next appointment:   In Person  Provider:   You may see MRudean Haskell MD or one of the following Advanced Practice Providers on your designated Care Team:    DMelina Copa PA-C  MErmalinda Barrios PA-C    Other Instructions  Your  cardiac CT will be scheduled at one of the below locations:   [facility] [phone] [phone] [facility] please [facility] at the [facility] of [facility] minutes prior to test start time. Proceed to the [facility] Department (first floor) to check-in and test prep.  [facility] please arrive 15 mins early for check-in and test prep.  Please follow these instructions carefully (unless otherwise directed):  Hold all erectile dysfunction medications at least 3 days (72 hrs) prior to test.  On the Night Before the Test: . Be sure to Drink plenty of water. . Do not consume any caffeinated/decaffeinated beverages or chocolate 12 hours prior to your test. . Do not take any antihistamines 12 hours prior to your test.  On the Day of the Test: . Drink plenty of water. Do not drink any water within one hour of the test. . Do not eat any food 4 hours prior to the test. . You may take your regular medications prior to the test.  . Take metoprolol (Lopressor) two hours prior to test. . HOLD Furosemide/Hydrochlorothiazide morning of the test.       After the Test: . Drink plenty of water. . After receiving IV contrast, you may experience a mild flushed feeling.  This is normal. . On occasion, you may experience a mild rash up to 24 hours after the test. This is not dangerous. If this occurs, you can take Benadryl 25 mg and increase your fluid intake. . If you experience trouble breathing, this can be serious. If it is severe call 911 IMMEDIATELY. If it is mild, please call our office. . If you take any of these medications: Glipizide/Metformin, Avandament, Glucavance, please do not take 48 hours after completing test unless  otherwise instructed.   Once we have confirmed authorization from your insurance company, we will call you to set up a date and time for your test. Based on how quickly your insurance processes prior authorizations requests, please allow up to 4 weeks to be contacted for scheduling your Cardiac CT appointment. Be advised that routine Cardiac CT appointments could be scheduled as many as 8 weeks after your provider has ordered it.  For non-scheduling related questions, please contact the cardiac imaging nurse navigator should you have any questions/concerns: Marchia Bond, Cardiac Imaging Nurse Navigator Burley Saver, Interim Cardiac Imaging Nurse [place] and Vascular Services Direct Office Dial: [phone]   For scheduling needs, including cancellations and rescheduling, please call [place], [phone] (temporary number).

## 2020-06-12 LAB — BASIC METABOLIC PANEL
BUN/Creatinine Ratio: 9 (ref 9–20)
BUN: 13 mg/dL (ref 6–24)
CO2: 28 mmol/L (ref 20–29)
Calcium: 9.7 mg/dL (ref 8.7–10.2)
Chloride: 104 mmol/L (ref 96–106)
Creatinine, Ser: 1.41 mg/dL — ABNORMAL HIGH (ref 0.76–1.27)
GFR calc Af Amer: 64 mL/min/{1.73_m2} (ref >59)
GFR calc non Af Amer: 56 mL/min/{1.73_m2} — ABNORMAL LOW (ref >59)
Glucose: 73 mg/dL (ref 65–99)
Potassium: 4.5 mmol/L (ref 3.5–5.2)
Sodium: 143 mmol/L (ref 134–144)

## 2020-06-15 NOTE — Progress Notes (Signed)
Subjective:   Patient ID: Kyle Jacobson, male   DOB: 54 y.o.   MRN: [id number]   HPI Patient presents stating getting a lot of pain in his right ankle and states it is been present for several months and does not remember specific injury.  States it is harder when he walks or stands.  Patient does not smoke likes to be active   Review of Systems  All other systems reviewed and are negative.       Objective:  Physical Exam Vitals and nursing note reviewed.  Constitutional:      Appearance: He is well-developed.  Pulmonary:     Effort: Pulmonary effort is normal.  Musculoskeletal:        General: Normal range of motion.  Skin:    General: Skin is warm.  Neurological:     Mental Status: He is alert.     Neurovascular status intact muscle strength found to be adequate range of motion within normal limits.  Patient does have moderate flatfoot deformity was noted to have quite a bit of discomfort in the posterior tibial tendon right as it comes underneath the medial malleolus inserts into the navicular.  Patient has moderate fusiform swelling around this area     Assessment:  Acute posterior tibial tendinitis right with inflammation fluid buildup     Plan:  H&P x-rays reviewed and at this point careful sheath injection administered 3 mg dexamethasone Kenalog 5 mg Xylocaine applied fascial brace to lift up the arch takes pressure off the plantar foot and medial arch and reappoint to recheck  X-rays indicate that there is moderate depression of the arch no indications of other types of pathology

## 2020-06-17 ENCOUNTER — Telehealth: Payer: Self-pay | Admitting: Internal Medicine

## 2020-06-17 NOTE — Telephone Encounter (Signed)
Kyle Jacobson is returning Ivy's call. Please advise.

## 2020-06-17 NOTE — Telephone Encounter (Signed)
Patient returning Kyle Jacobson's phone call, connected call to Kyle Jacobson.

## 2020-06-17 NOTE — Telephone Encounter (Signed)
The patient has been notified of the result and verbalized understanding.  All questions (if any) were answered. Cleon Gustin, RN 06/17/2020 10:16 AM

## 2020-06-17 NOTE — Telephone Encounter (Signed)
Left message for patient to call back  

## 2020-06-18 ENCOUNTER — Other Ambulatory Visit: Payer: Self-pay | Admitting: Family Medicine

## 2020-06-23 ENCOUNTER — Other Ambulatory Visit: Payer: Self-pay | Admitting: Family Medicine

## 2020-06-23 DIAGNOSIS — D492 Neoplasm of unspecified behavior of bone, soft tissue, and skin: Secondary | ICD-10-CM

## 2020-07-01 ENCOUNTER — Telehealth (HOSPITAL_COMMUNITY): Payer: Self-pay | Admitting: Emergency Medicine

## 2020-07-01 NOTE — Telephone Encounter (Signed)
Attempted to call patient regarding upcoming cardiac CT appointment. °Left message on voicemail with name and callback number °Juno Bozard RN Navigator Cardiac Imaging °[facility] °[phone] Office °[phone] Cell ° °

## 2020-07-01 NOTE — Telephone Encounter (Signed)
Pt returning phone call regarding upcoming cardiac imaging study; pt verbalizes understanding of appt date/time, parking situation and where to check in, pre-test NPO status and medications ordered, and verified current allergies; name and call back number provided for further questions should they arise Krish Bailly RN Navigator Cardiac Imaging [facility] [phone] office [phone] cell   

## 2020-07-02 ENCOUNTER — Ambulatory Visit (HOSPITAL_COMMUNITY)
Admission: RE | Admit: 2020-07-02 | Discharge: 2020-07-02 | Disposition: A | Discharge status: Home / Self Care | Payer: BC Managed Care – PPO | Source: Ambulatory Visit | Attending: Internal Medicine | Admitting: Internal Medicine

## 2020-07-02 ENCOUNTER — Other Ambulatory Visit: Payer: Self-pay

## 2020-07-02 DIAGNOSIS — R072 Precordial pain: Secondary | ICD-10-CM | POA: Diagnosis not present

## 2020-07-02 MED ORDER — NITROGLYCERIN 0.4 MG SL SUBL
SUBLINGUAL_TABLET | SUBLINGUAL | Status: AC
  Administered 2020-07-02: 0.8 mg
  Filled 2020-07-02: qty 2

## 2020-07-02 MED ORDER — NITROGLYCERIN 0.4 MG SL SUBL: 0.8000 mg | SUBLINGUAL_TABLET | SUBLINGUAL | Status: DC | PRN

## 2020-07-02 MED ORDER — IOHEXOL 350 MG/ML SOLN
80.0000 mL | Freq: Once | INTRAVENOUS | Status: AC | PRN
  Administered 2020-07-02: 80 mL via INTRAVENOUS

## 2020-07-03 ENCOUNTER — Telehealth: Payer: Self-pay | Admitting: Internal Medicine

## 2020-07-03 NOTE — Telephone Encounter (Signed)
° ° °  Pt is returning call to get Ct result

## 2020-07-03 NOTE — Progress Notes (Signed)
Left msg on patients voicemail for him to return call to the office.

## 2020-07-03 NOTE — Telephone Encounter (Signed)
Returned call to pt, his phone rings once and then goes to voicemail. I left another msg for him to return call to the office.

## 2020-07-06 ENCOUNTER — Telehealth: Payer: Self-pay | Admitting: Internal Medicine

## 2020-07-06 NOTE — Telephone Encounter (Signed)
See results

## 2020-07-06 NOTE — Telephone Encounter (Signed)
Follow up:   Patient returning a call back.  

## 2020-07-06 NOTE — Telephone Encounter (Signed)
See CT results.  

## 2020-07-06 NOTE — Telephone Encounter (Signed)
Patient returning call.

## 2020-07-31 DIAGNOSIS — J069 Acute upper respiratory infection, unspecified: Secondary | ICD-10-CM | POA: Diagnosis not present

## 2020-07-31 DIAGNOSIS — Z87891 Personal history of nicotine dependence: Secondary | ICD-10-CM | POA: Diagnosis not present

## 2020-07-31 DIAGNOSIS — R0981 Nasal congestion: Secondary | ICD-10-CM | POA: Diagnosis not present

## 2020-07-31 DIAGNOSIS — U071 COVID-19: Secondary | ICD-10-CM | POA: Diagnosis not present

## 2020-07-31 DIAGNOSIS — I10 Essential (primary) hypertension: Secondary | ICD-10-CM | POA: Diagnosis not present

## 2020-07-31 DIAGNOSIS — R059 Cough, unspecified: Secondary | ICD-10-CM | POA: Diagnosis not present

## 2020-08-27 ENCOUNTER — Other Ambulatory Visit: Payer: Self-pay | Admitting: Family Medicine

## 2020-09-04 ENCOUNTER — Ambulatory Visit
Admission: RE | Admit: 2020-09-04 | Discharge: 2020-09-04 | Disposition: A | Discharge status: Home / Self Care | Payer: BC Managed Care – PPO | Source: Ambulatory Visit | Attending: Family Medicine | Admitting: Family Medicine

## 2020-09-04 ENCOUNTER — Other Ambulatory Visit: Payer: Self-pay

## 2020-09-04 DIAGNOSIS — I700 Atherosclerosis of aorta: Secondary | ICD-10-CM | POA: Diagnosis not present

## 2020-09-04 DIAGNOSIS — D492 Neoplasm of unspecified behavior of bone, soft tissue, and skin: Secondary | ICD-10-CM

## 2020-09-04 MED ORDER — IOPAMIDOL (ISOVUE-300) INJECTION 61%
75.0000 mL | Freq: Once | INTRAVENOUS | Status: AC | PRN
  Administered 2020-09-04: 75 mL via INTRAVENOUS

## 2020-09-11 ENCOUNTER — Other Ambulatory Visit: Payer: BC Managed Care – PPO | Admitting: *Deleted

## 2020-09-11 ENCOUNTER — Other Ambulatory Visit: Payer: Self-pay

## 2020-09-11 DIAGNOSIS — E785 Hyperlipidemia, unspecified: Secondary | ICD-10-CM | POA: Diagnosis not present

## 2020-09-11 LAB — LIPID PANEL
Chol/HDL Ratio: 3.1 ratio (ref 0.0–5.0)
Cholesterol, Total: 164 mg/dL (ref 100–199)
HDL: 53 mg/dL (ref >39)
LDL Chol Calc (NIH): 98 mg/dL (ref 0–99)
Triglycerides: 68 mg/dL (ref 0–149)
VLDL Cholesterol Cal: 13 mg/dL (ref 5–40)

## 2020-09-11 LAB — HEPATIC FUNCTION PANEL
ALT: 27 IU/L (ref 0–44)
AST: 20 IU/L (ref 0–40)
Albumin: 4.7 g/dL (ref 3.8–4.9)
Alkaline Phosphatase: 83 IU/L (ref 44–121)
Bilirubin Total: 1.4 mg/dL — ABNORMAL HIGH (ref 0.0–1.2)
Bilirubin, Direct: 0.27 mg/dL (ref 0.00–0.40)
Total Protein: 7.3 g/dL (ref 6.0–8.5)

## 2020-09-14 ENCOUNTER — Telehealth: Payer: Self-pay

## 2020-09-14 NOTE — Telephone Encounter (Signed)
Attempted phone call to pt.  Left voicemail message to contact triage at [phone]. 

## 2020-09-14 NOTE — Telephone Encounter (Signed)
-----   Message from Werner Lean, MD sent at 09/13/2020  1:49 PM EST ----- Results: Improvement in cholesterol with similar labs; not yet at goal (LDL < 70) Plan: Increase to atorvastatin 20 mg po daily; repeat lipids and LFTs in 3 months  Werner Lean, MD

## 2020-09-16 ENCOUNTER — Other Ambulatory Visit: Payer: Self-pay

## 2020-09-16 ENCOUNTER — Telehealth: Payer: Self-pay

## 2020-09-16 DIAGNOSIS — E785 Hyperlipidemia, unspecified: Secondary | ICD-10-CM

## 2020-09-16 MED ORDER — ATORVASTATIN CALCIUM 10 MG PO TABS: 20.0000 mg | ORAL_TABLET | Freq: Every day | ORAL | 2 refills | Status: AC

## 2020-09-16 NOTE — Telephone Encounter (Signed)
Message from Werner Lean, MD sent at 09/13/2020  1:49 PM EST -----Results: Improvement in cholesterol with similar labs; not yet at goal (LDL < 70) Plan: Increase to atorvastatin 20 mg po daily; repeat lipids and LFTs in 3 months. Pt given these results, per MD. Pt verbalized understanding and has no further questions/concerns at this time.

## 2020-09-16 NOTE — Telephone Encounter (Signed)
Kyle Jacobson is returning Marsha's call. Please advise.

## 2020-12-18 ENCOUNTER — Other Ambulatory Visit: Payer: BC Managed Care – PPO

## 2021-10-15 DIAGNOSIS — E785 Hyperlipidemia, unspecified: Secondary | ICD-10-CM | POA: Diagnosis not present

## 2021-10-15 DIAGNOSIS — K219 Gastro-esophageal reflux disease without esophagitis: Secondary | ICD-10-CM | POA: Diagnosis not present

## 2021-10-15 DIAGNOSIS — I10 Essential (primary) hypertension: Secondary | ICD-10-CM | POA: Diagnosis not present

## 2021-10-15 DIAGNOSIS — Z125 Encounter for screening for malignant neoplasm of prostate: Secondary | ICD-10-CM | POA: Diagnosis not present

## 2021-10-15 DIAGNOSIS — N529 Male erectile dysfunction, unspecified: Secondary | ICD-10-CM | POA: Diagnosis not present

## 2021-10-15 DIAGNOSIS — Z0000 Encounter for general adult medical examination without abnormal findings: Secondary | ICD-10-CM | POA: Diagnosis not present

## 2021-12-23 DIAGNOSIS — N4889 Other specified disorders of penis: Secondary | ICD-10-CM | POA: Diagnosis not present

## 2021-12-23 DIAGNOSIS — Z202 Contact with and (suspected) exposure to infections with a predominantly sexual mode of transmission: Secondary | ICD-10-CM | POA: Diagnosis not present

## 2022-01-07 DIAGNOSIS — R399 Unspecified symptoms and signs involving the genitourinary system: Secondary | ICD-10-CM | POA: Diagnosis not present

## 2022-01-07 DIAGNOSIS — R319 Hematuria, unspecified: Secondary | ICD-10-CM | POA: Diagnosis not present

## 2022-01-07 DIAGNOSIS — R300 Dysuria: Secondary | ICD-10-CM | POA: Diagnosis not present

## 2022-01-07 DIAGNOSIS — N4889 Other specified disorders of penis: Secondary | ICD-10-CM | POA: Diagnosis not present

## 2022-01-07 DIAGNOSIS — R350 Frequency of micturition: Secondary | ICD-10-CM | POA: Diagnosis not present

## 2022-01-21 DIAGNOSIS — E785 Hyperlipidemia, unspecified: Secondary | ICD-10-CM | POA: Diagnosis not present

## 2022-01-21 DIAGNOSIS — I10 Essential (primary) hypertension: Secondary | ICD-10-CM | POA: Diagnosis not present

## 2022-01-21 DIAGNOSIS — I700 Atherosclerosis of aorta: Secondary | ICD-10-CM | POA: Diagnosis not present

## 2022-02-04 DIAGNOSIS — E860 Dehydration: Secondary | ICD-10-CM | POA: Diagnosis not present

## 2022-03-02 ENCOUNTER — Ambulatory Visit: Payer: BC Managed Care – PPO | Admitting: Internal Medicine

## 2022-05-27 IMAGING — US US SOFT TISSUE
1 series · 13 of 13 positions shown · non-contrast
Comparison: None.

CLINICAL DATA: Nodule along the costosternal border.

EXAM:
ULTRASOUND OF HEAD/NECK SOFT TISSUES
TECHNIQUE: Ultrasound examination of the head and neck soft tissues was
performed in the area of clinical concern.

[Series 1: us soft tissue · 0.06mm/px · 13 acquisitions, 13 frames shown]
[im 1/13]
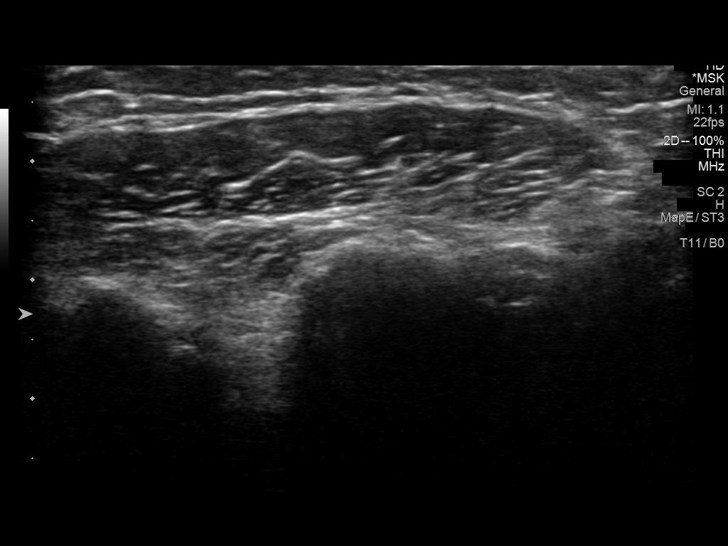
[im 2/13]
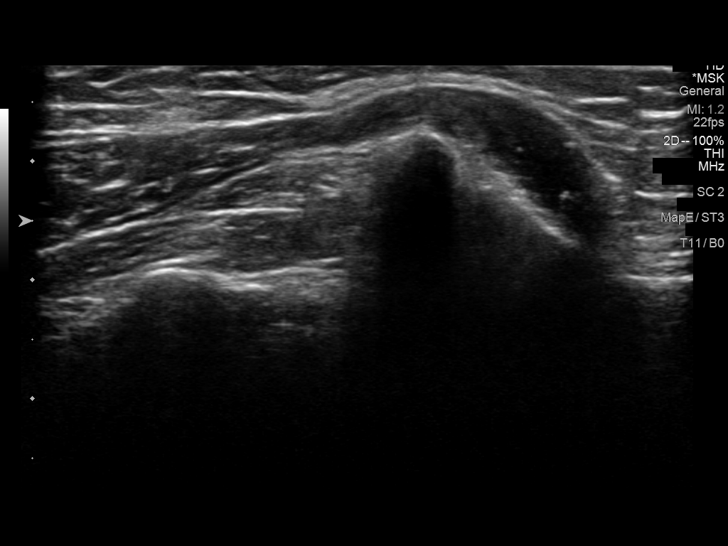
[im 3/13]
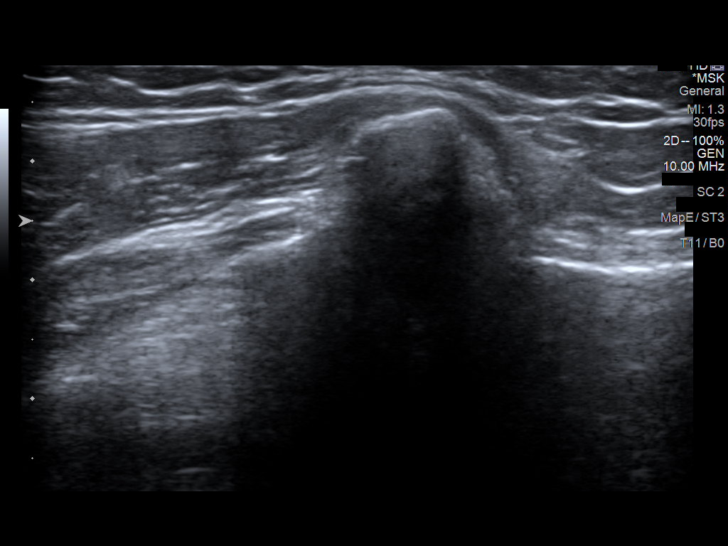
[im 4/13]
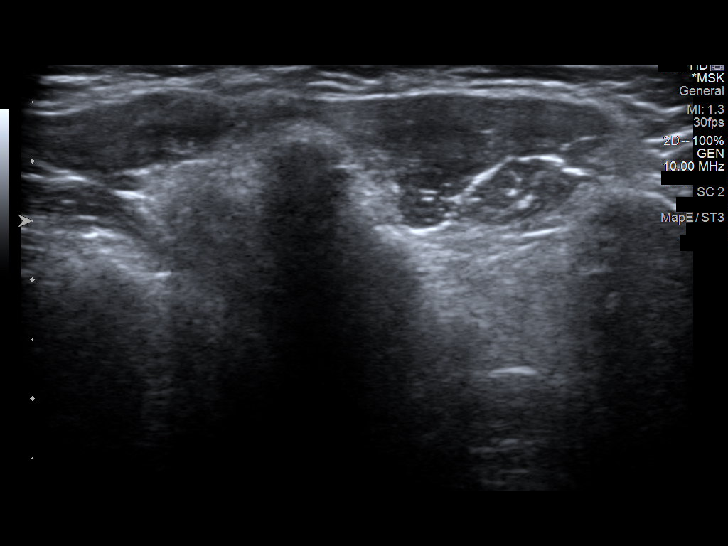
[im 5/13]
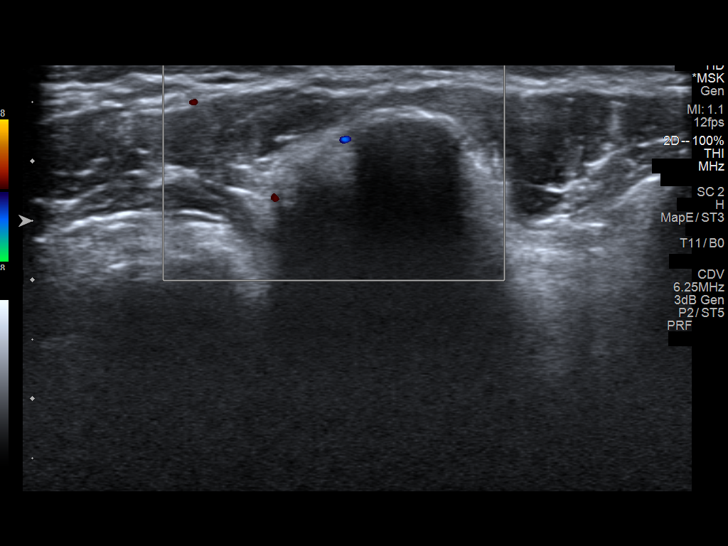
[im 6/13]
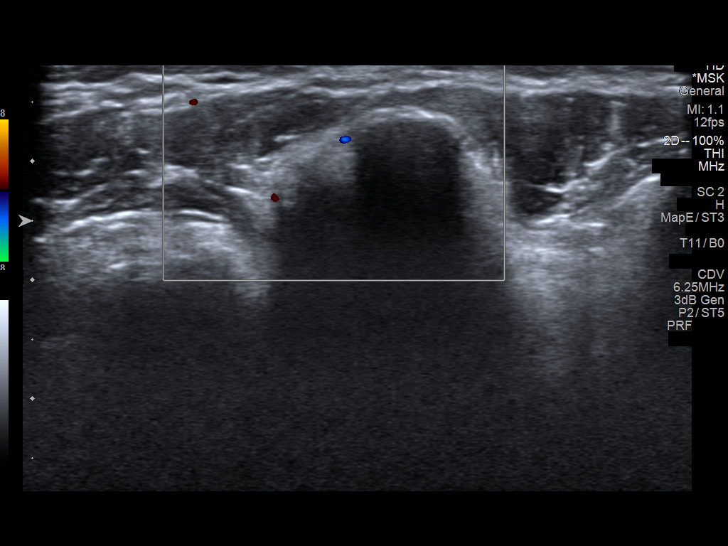
[im 7/13]
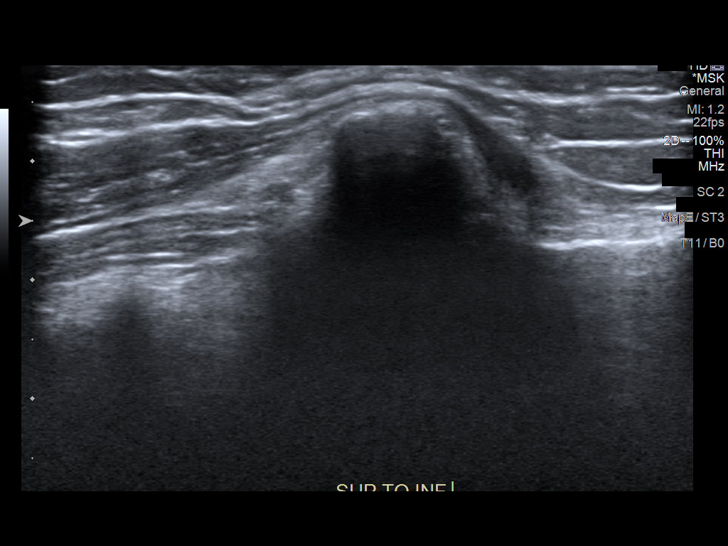
[im 8/13]
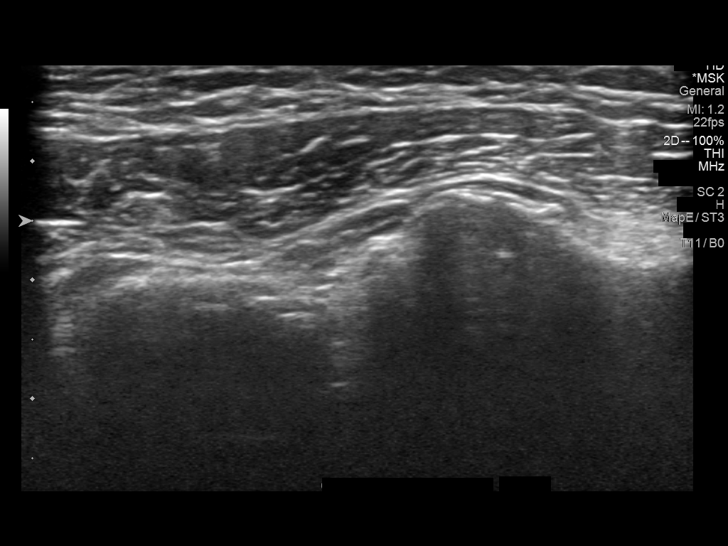
[im 9/13]
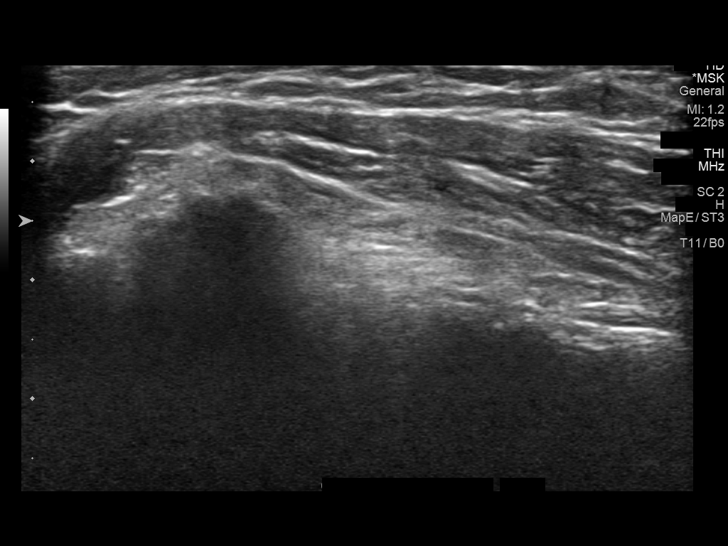
[im 10/13]
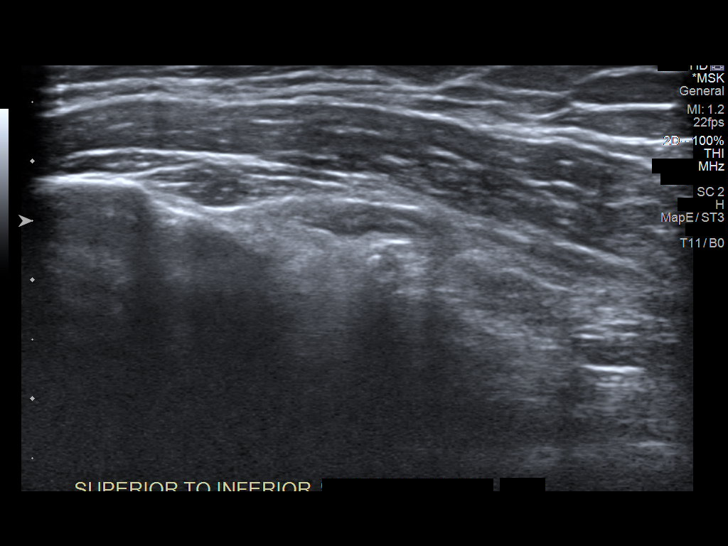
[im 11/13]
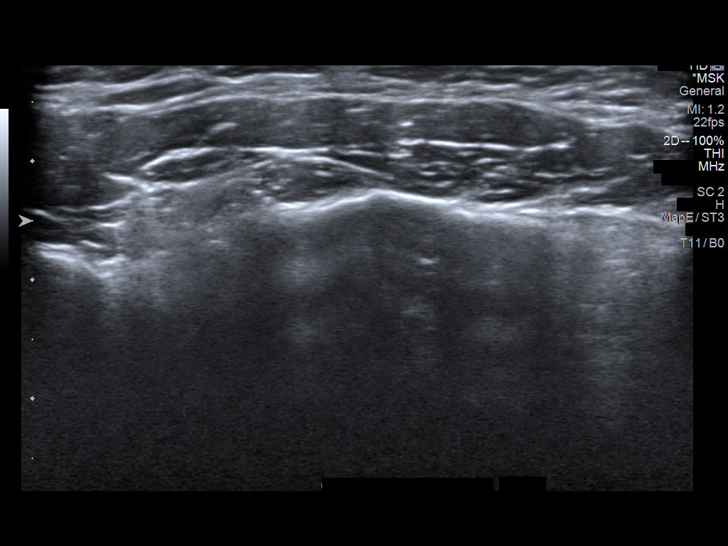
[im 12/13]
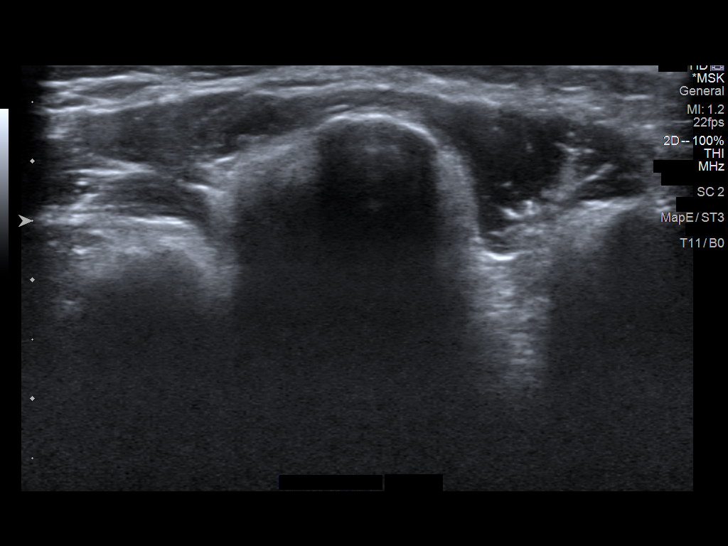
[im 13/13]
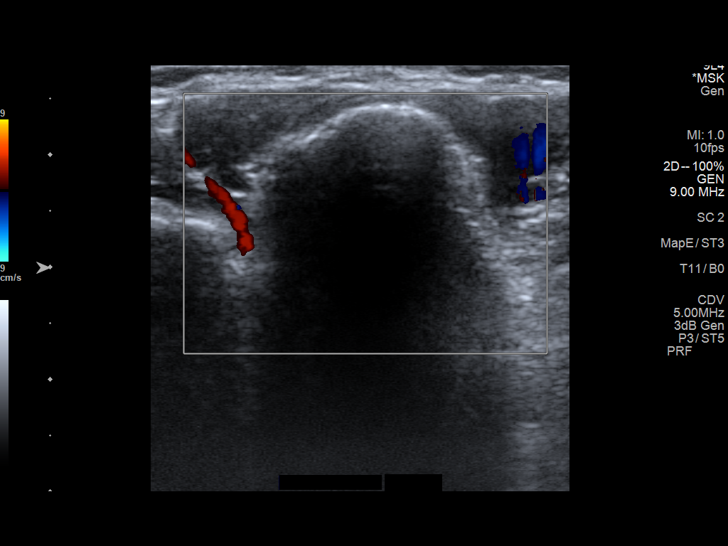

[13 of 13 positions shown; findings below may reference images not displayed]

FINDINGS: The palpable area demonstrate significant shadowing and likely
represents cortical bone, most likely the head of the clavicle. This
is much less prominent on the left. No discrete cyst fluid
collection is evident.
IMPRESSION: The palpable area appears corticated. This likely represents
degenerative change of the clavicle head. CT of the chest with
contrast would be useful for further evaluation of this area. No
definite cyst is identified.

## 2022-06-23 IMAGING — CT CT HEART MORP W/ CTA COR W/ SCORE W/ CA W/CM &/OR W/O CM
1 series · 6 of 12 positions shown, 8 images · non-contrast
Comparison: None.
COMPARISON: None.

Addendum:
EXAM:
OVER-READ INTERPRETATION  CT CHEST

The following report is an over-read performed by radiologist Dr.
Benji Germain [REDACTED] on 07/02/2020. This
over-read does not include interpretation of cardiac or coronary
anatomy or pathology. The coronary calcium score/coronary CTA
interpretation by the cardiologist is attached.
CLINICAL DATA: 54 year old African American Male
Cardiac/Coronary  CTA
TECHNIQUE: The patient was scanned on a [employer] Force scanner.

[Series 354: — · 6 of 12 slices shown, 8 images]
[im 2/12  vessel]
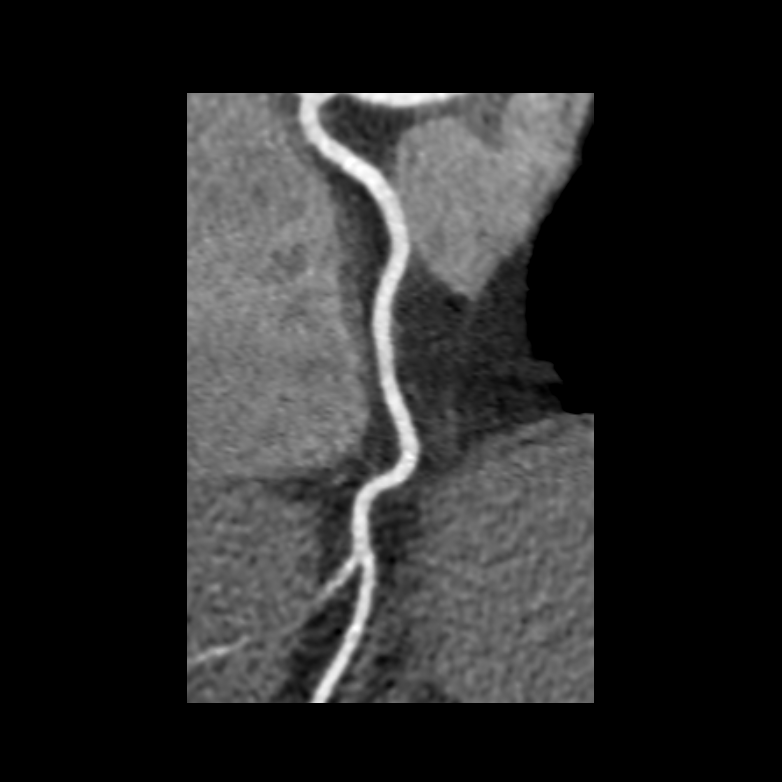
[im 2/12  lung]
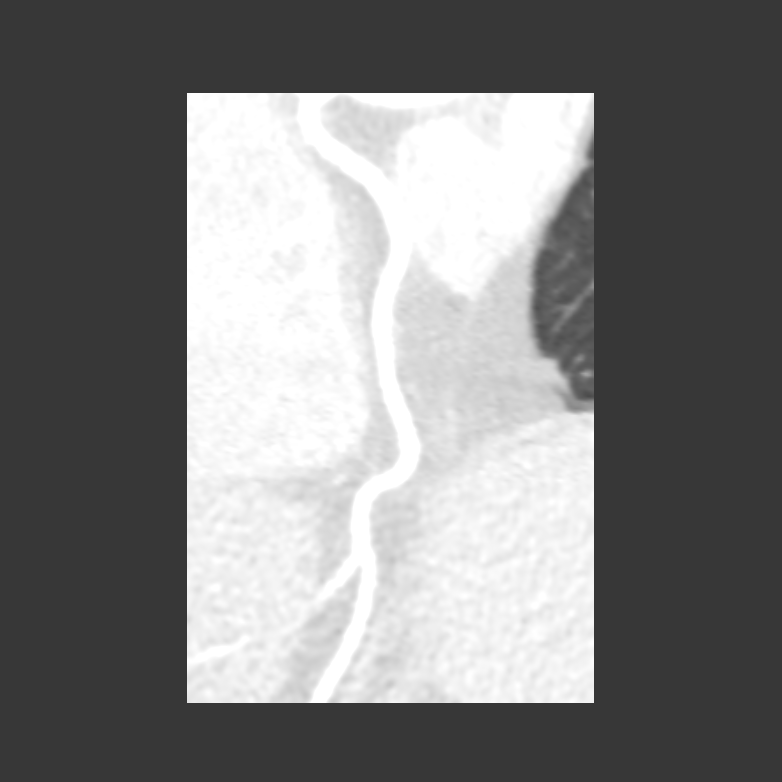
[im 4/12  vessel]
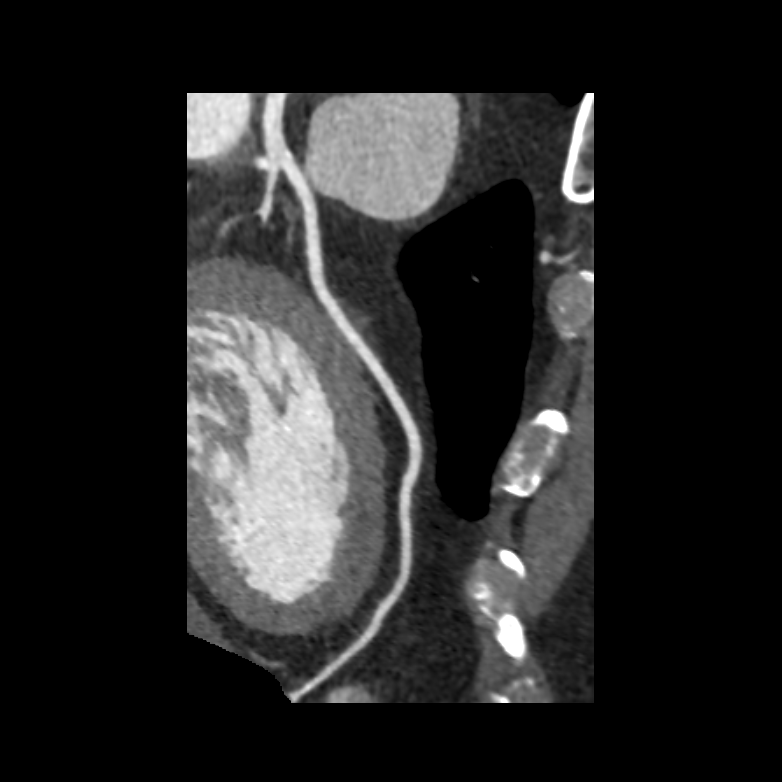
[im 6/12  vessel]
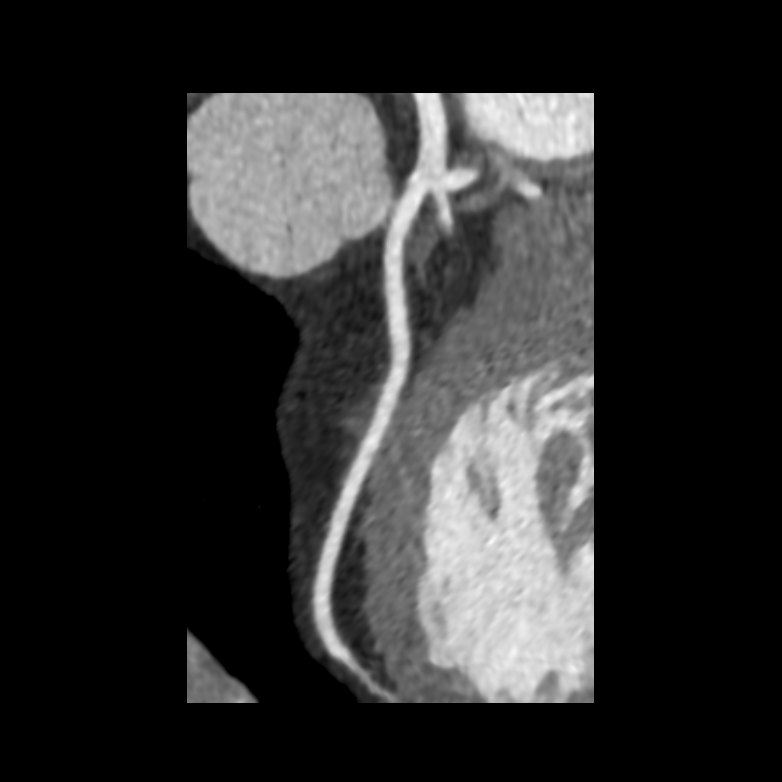
[im 7/12  vessel]
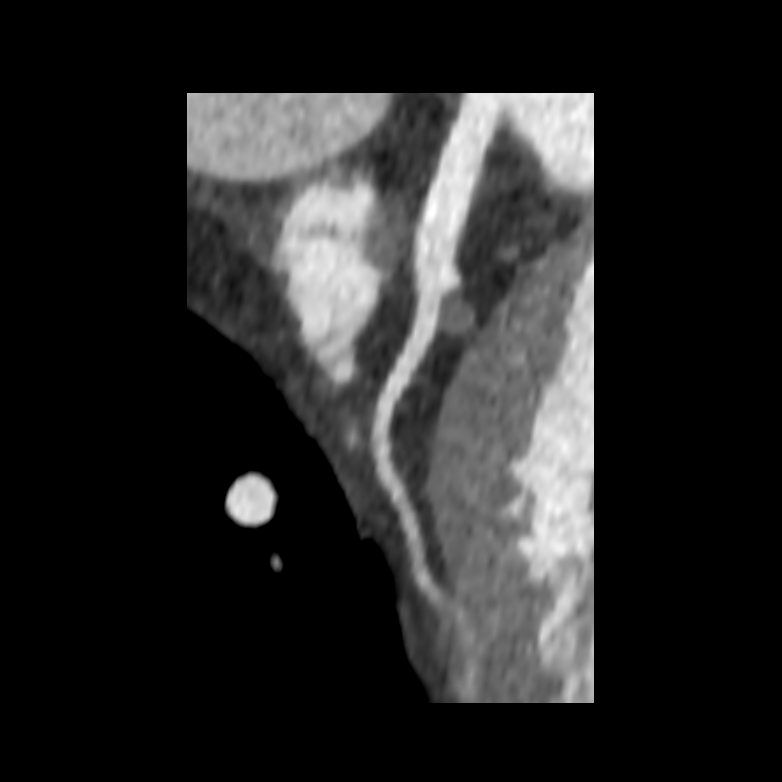
[im 9/12  vessel]
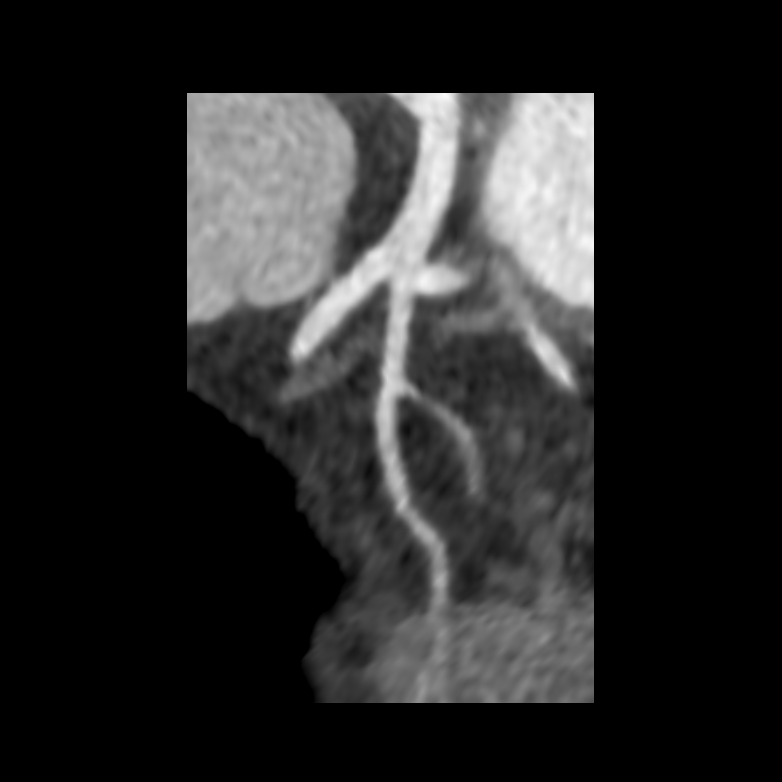
[im 9/12  lung]
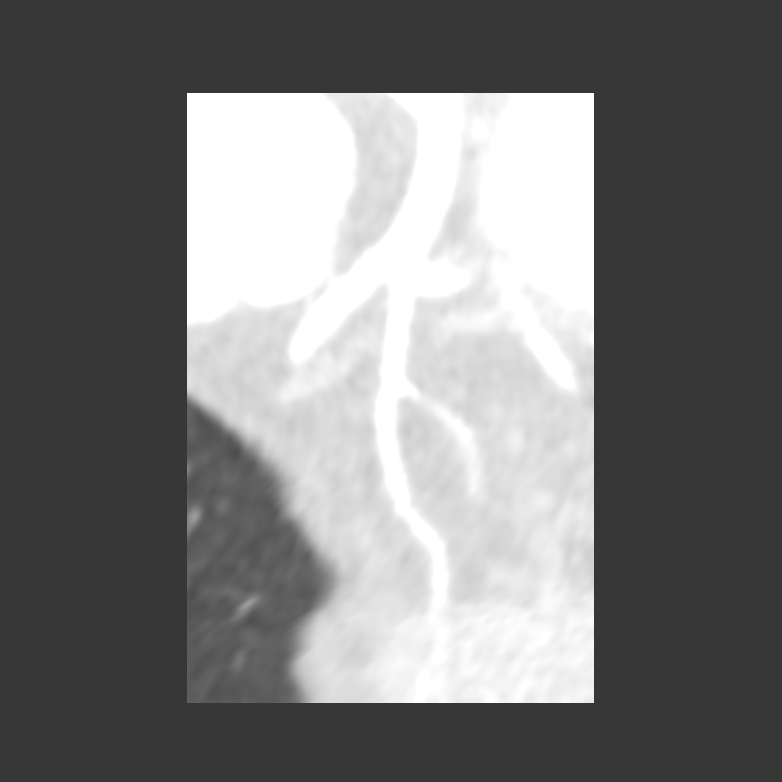
[im 11/12  vessel]
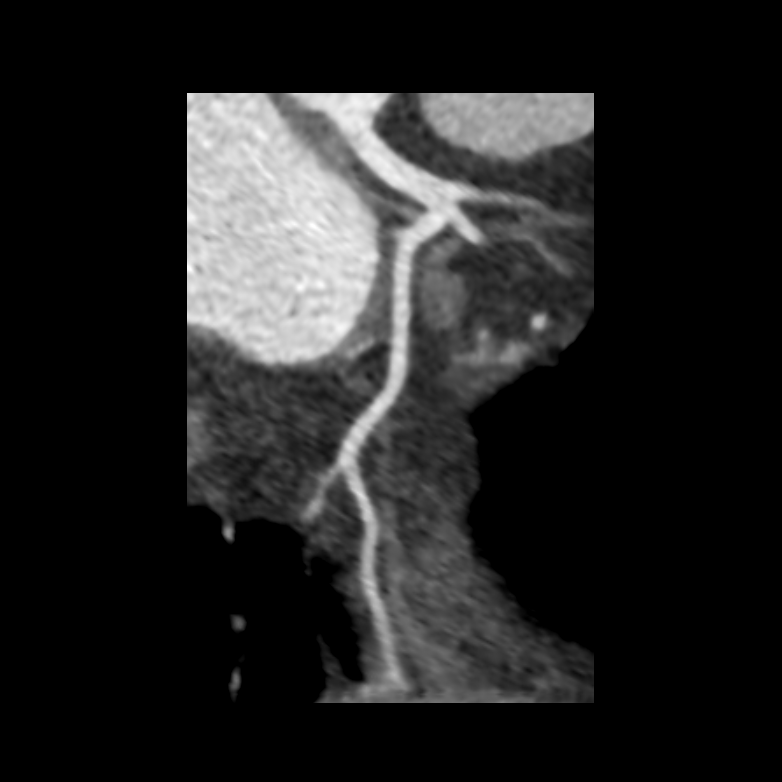

[6 of 12 positions shown; findings below may reference images not displayed]

FINDINGS: Within the visualized portions of the thorax there are no suspicious
appearing pulmonary nodules or masses, there is no acute
consolidative airspace disease, no pleural effusions, no
pneumothorax and no lymphadenopathy. Visualized portions of the
upper abdomen are unremarkable. There are no aggressive appearing
lytic or blastic lesions noted in the visualized portions of the
skeleton.
IMPRESSION: No significant incidental noncardiac findings are noted.
FINDINGS: A 100 kV prospective scan was triggered in the descending thoracic
aorta at 111 HU's. Axial non-contrast 3 mm slices were carried out
through the heart. The data set was analyzed on a dedicated work
station and scored using the Agatson method. Gantry rotation speed
was 250 msecs and collimation was .6 mm. No beta blockade and 0.8 mg
of sl NTG was given. The 3D data set was reconstructed in 5%
intervals of the 67-82 % of the R-R cycle. Diastolic phases were
analyzed on a dedicated work station using MPR, MIP and VRT modes.
The patient received 80 cc of contrast.

Aorta:  Normal size.  No calcifications.  No dissection.

Aortic Valve:  Tri-leaflet.  No calcifications.

Coronary Arteries:  Normal coronary origin.  Right dominance.

Coronary calcium score of 1.22. This was 64th percentile for age,
sex, and race matched control.

RCA is a large dominant artery that gives rise to PDA and PLA. There
is no plaque.

Left main is a large artery that gives rise to LAD and LCX arteries.

LAD is a large vessel that gives rise to two diminutive diagonal
vessels. There is a mild, non-obstructive (25-49%) calcified plaque
in the ostium of the LAD.

LCX is a non-dominant artery that gives rise to one small OM1
branch. There is a minimal, non-obstructive (1-24%) plaque in the
mid vessel.

There is a ramus intermedius vessel.  There is no plaque.

Other findings:

Normal pulmonary vein drainage into the left atrium.

Normal left atrial appendage without a thrombus.

Normal size of the pulmonary artery.

Extra-cardiac findings: See attached radiology report for
non-cardiac structures.
IMPRESSION: 1. Coronary calcium score of 1.22. This was 64th percentile for age,
sex, and race matched control.

2. Normal coronary origin with right dominance.

3. CAD-RADS 2. Mild non-obstructive CAD (25-49%). Consider
non-atherosclerotic causes of chest pain. Consider preventive
therapy and risk factor modification.

*** End of Addendum ***
EXAM:
OVER-READ INTERPRETATION  CT CHEST

The following report is an over-read performed by radiologist Dr.
Benji Germain [REDACTED] on 07/02/2020. This
over-read does not include interpretation of cardiac or coronary
anatomy or pathology. The coronary calcium score/coronary CTA
interpretation by the cardiologist is attached.
FINDINGS: Within the visualized portions of the thorax there are no suspicious
appearing pulmonary nodules or masses, there is no acute
consolidative airspace disease, no pleural effusions, no
pneumothorax and no lymphadenopathy. Visualized portions of the
upper abdomen are unremarkable. There are no aggressive appearing
lytic or blastic lesions noted in the visualized portions of the
skeleton.
IMPRESSION: No significant incidental noncardiac findings are noted.

## 2022-07-28 DIAGNOSIS — R0789 Other chest pain: Secondary | ICD-10-CM | POA: Diagnosis not present

## 2022-08-26 IMAGING — CT CT CHEST W/ CM
1 series · 15 of 34 positions shown, 19 images · IV contrast (iopamidol)
Comparison: Cardiac CT 07/02/2020.

CLINICAL DATA: 54-year-old male with history of palpable knot in
the right mid sternal area.

EXAM:
CT CHEST WITH CONTRAST
TECHNIQUE: Multidetector CT imaging of the chest was performed during
intravenous contrast administration.
CONTRAST:  75mL 8KSNSX-S33 IOPAMIDOL (8KSNSX-S33) INJECTION 61%

[Series 2: chest w/cm · axial · 0.73mm/px · z∈[-305,-35]mm · 15 of 159 slices shown, 19 images]
[im 12/159  mediastinal]
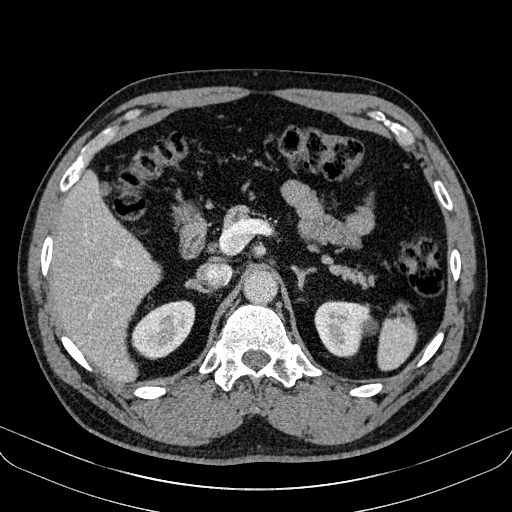
[im 12/159  lung]
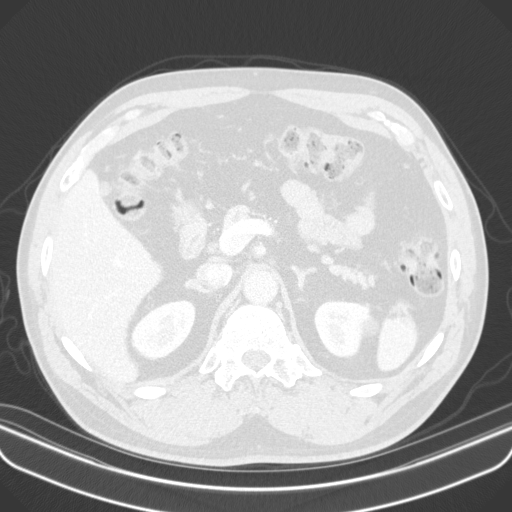
[im 24/159  lung]
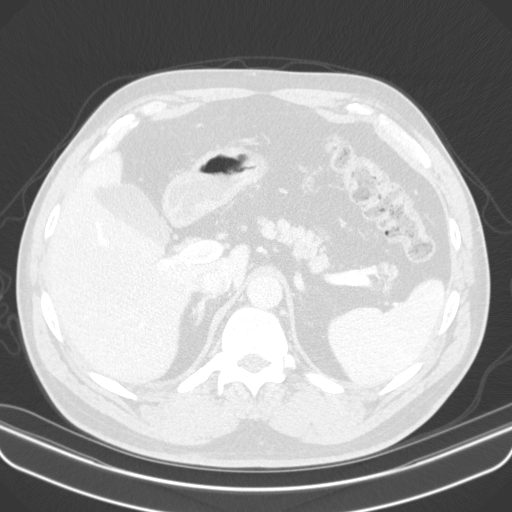
[im 32/159  lung]
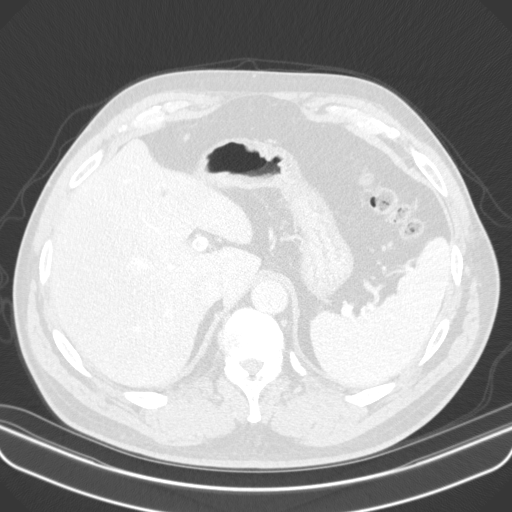
[im 41/159  lung]
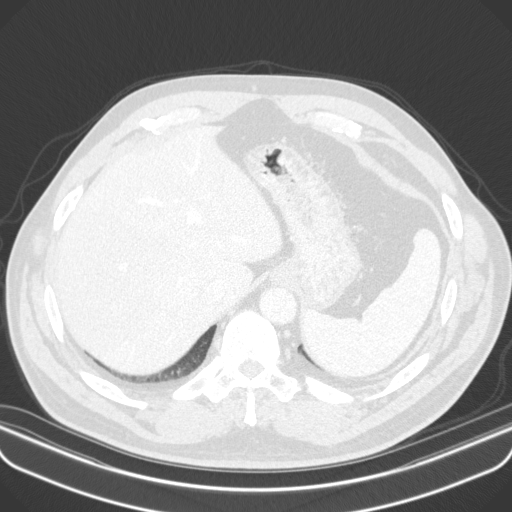
[im 53/159  mediastinal]
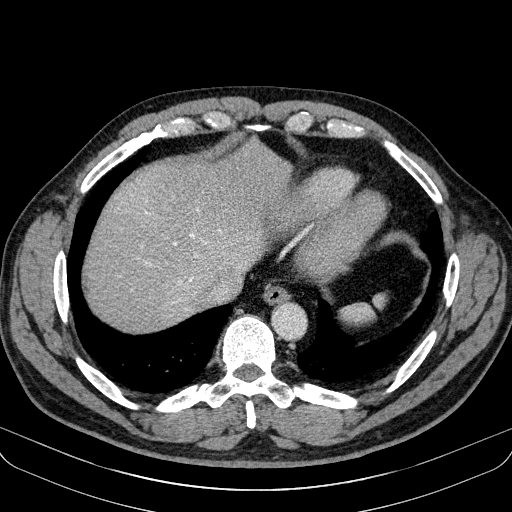
[im 53/159  lung]
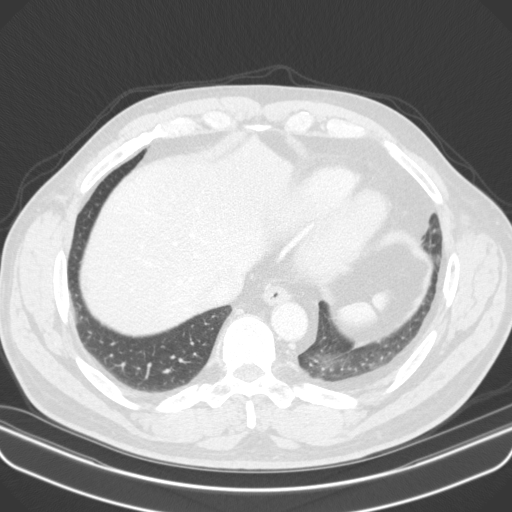
[im 64/159  lung]
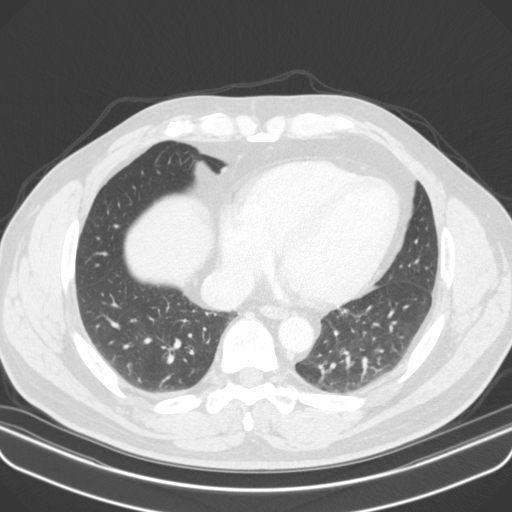
[im 71/159  lung]
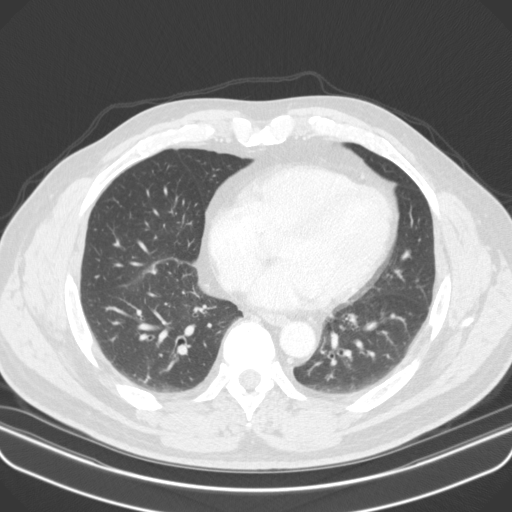
[im 82/159  lung]
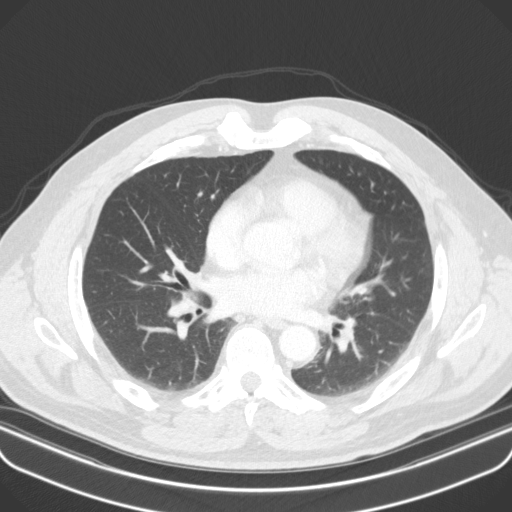
[im 88/159  mediastinal]
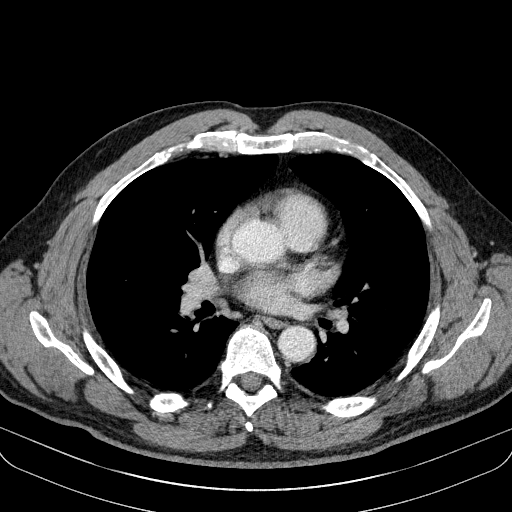
[im 88/159  lung]
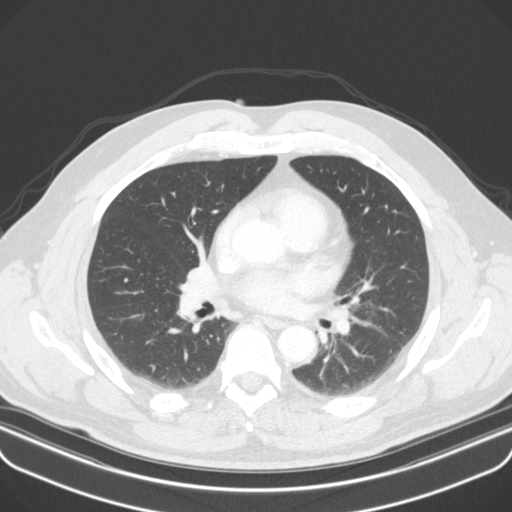
[im 95/159  lung]
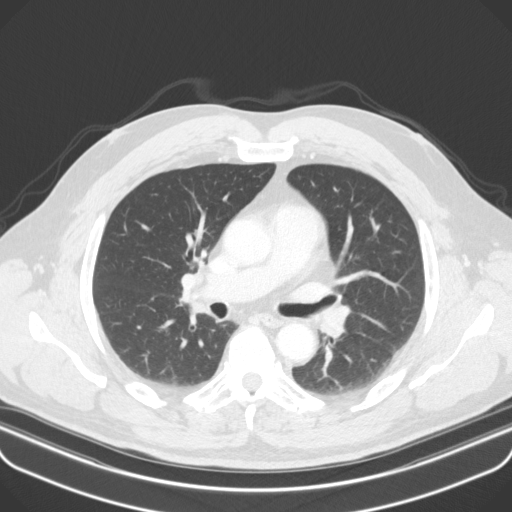
[im 106/159  lung]
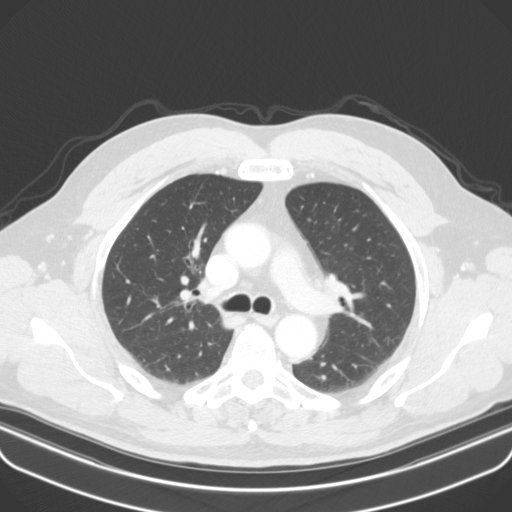
[im 118/159  lung]
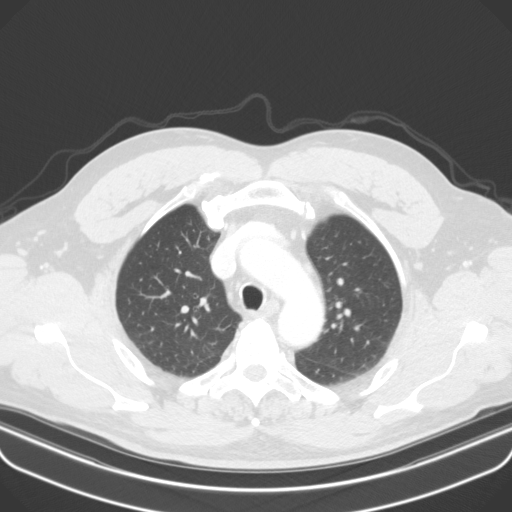
[im 127/159  mediastinal]
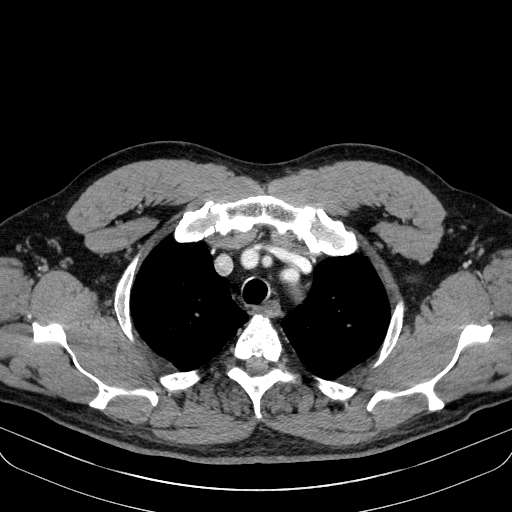
[im 127/159  lung]
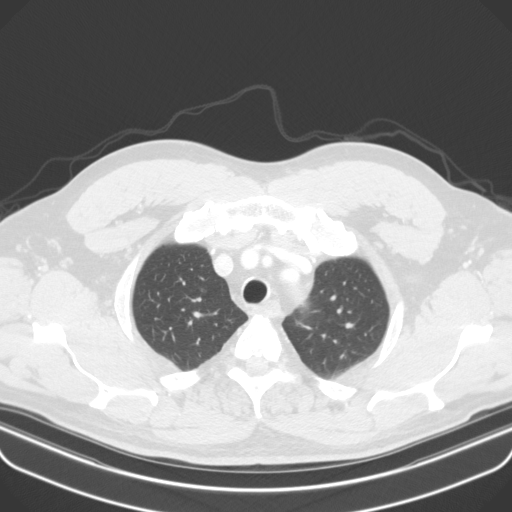
[im 135/159  lung]
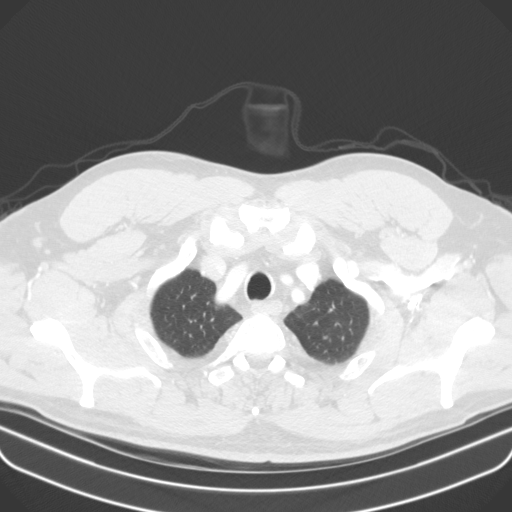
[im 147/159  lung]
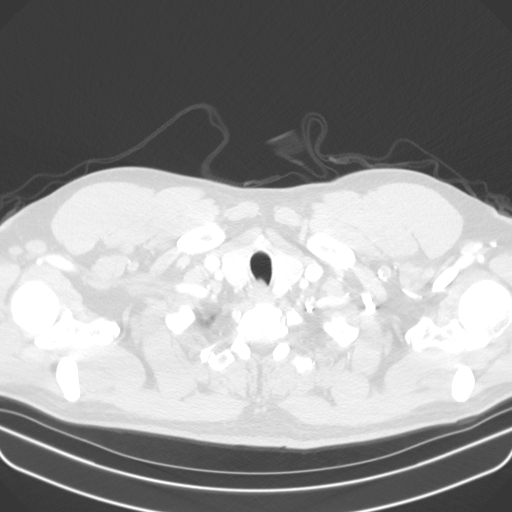

[15 of 34 positions shown; findings below may reference images not displayed]

FINDINGS: Cardiovascular: Heart size is normal. There is no significant
pericardial fluid, thickening or pericardial calcification. Aortic
atherosclerosis. No definite coronary artery calcifications.

Mediastinum/Nodes: No pathologically enlarged mediastinal or hilar
lymph nodes. Esophagus is unremarkable in appearance. No axillary
lymphadenopathy.

Lungs/Pleura: No suspicious appearing pulmonary nodules or masses
are noted. No acute consolidative airspace disease. No pleural
effusions.

Upper Abdomen: Subcentimeter low-attenuation lesions in the liver,
too small to characterize, but statistically likely to represent
tiny cysts. 2.4 cm low-attenuation lesion in the upper pole the left
kidney, compatible with a simple cyst.

Musculoskeletal: No findings to account for the perceived palpable
abnormality in the sternal region. There are no aggressive appearing
lytic or blastic lesions noted in the visualized portions of the
skeleton.
IMPRESSION: 1. No findings to account for the perceived palpable abnormality in
the sternal region.
2. Aortic atherosclerosis.

Aortic Atherosclerosis ([id number].D).

## 2022-10-14 DIAGNOSIS — N529 Male erectile dysfunction, unspecified: Secondary | ICD-10-CM | POA: Diagnosis not present

## 2022-10-14 DIAGNOSIS — R399 Unspecified symptoms and signs involving the genitourinary system: Secondary | ICD-10-CM | POA: Diagnosis not present

## 2022-10-14 DIAGNOSIS — E785 Hyperlipidemia, unspecified: Secondary | ICD-10-CM | POA: Diagnosis not present

## 2022-10-14 DIAGNOSIS — I10 Essential (primary) hypertension: Secondary | ICD-10-CM | POA: Diagnosis not present

## 2022-10-14 DIAGNOSIS — Z0000 Encounter for general adult medical examination without abnormal findings: Secondary | ICD-10-CM | POA: Diagnosis not present

## 2022-10-14 DIAGNOSIS — Z125 Encounter for screening for malignant neoplasm of prostate: Secondary | ICD-10-CM | POA: Diagnosis not present

## 2023-01-27 DIAGNOSIS — M25521 Pain in right elbow: Secondary | ICD-10-CM | POA: Diagnosis not present

## 2023-02-16 NOTE — Progress Notes (Unsigned)
Office Visit Note   Patient: Kyle Jacobson           Date of Birth: [birth year]           MRN: [id number] Visit Date: 02/17/2023              Requested by: Alyson Ingles, PA-C [phone]. [address] [place],  [place] [address] PCP: Tally Joe, MD   Assessment & Plan: Visit Diagnoses: No diagnosis found.  Plan: ***  Follow-Up Instructions: No follow-ups on file.   Orders:  No orders of the defined types were placed in this encounter.  No orders of the defined types were placed in this encounter.     Procedures: No procedures performed   Clinical Data: No additional findings.   Subjective: No chief complaint on file.   HPI  Review of Systems  Constitutional: Negative.   HENT: Negative.    Eyes: Negative.   Respiratory: Negative.    Cardiovascular: Negative.   Gastrointestinal: Negative.   Endocrine: Negative.   Genitourinary: Negative.   Skin: Negative.   Allergic/Immunologic: Negative.   Neurological: Negative.   Hematological: Negative.   Psychiatric/Behavioral: Negative.    All other systems reviewed and are negative.   Objective: Vital Signs: There were no vitals taken for this visit.  Physical Exam Vitals and nursing note reviewed.  Constitutional:      Appearance: He is well-developed.  HENT:     Head: Normocephalic and atraumatic.  Eyes:     Pupils: Pupils are equal, round, and reactive to light.  Pulmonary:     Effort: Pulmonary effort is normal.  Abdominal:     Palpations: Abdomen is soft.  Musculoskeletal:        General: Normal range of motion.     Cervical back: Neck supple.  Skin:    General: Skin is warm.  Neurological:     Mental Status: He is alert and oriented to person, place, and time.  Psychiatric:        Behavior: Behavior normal.        Thought Content: Thought content normal.        Judgment: Judgment normal.   Ortho Exam  Specialty Comments:  No specialty comments available.  Imaging: No  results found.   PMFS History: Patient Active Problem List   Diagnosis Date Noted  . Chest pain of uncertain etiology 06/11/2020  . Essential hypertension 06/11/2020  . Hyperlipidemia 06/11/2020   Past Medical History:  Diagnosis Date  . Allergic rhinitis   . Fatigue   . GERD (gastroesophageal reflux disease)   . Hyperlipidemia   . Libido, decreased   . Low back pain   . OSA (obstructive sleep apnea)   . Weight gain     Family History  Problem Relation Age of Onset  . Healthy Mother   . Other Father        unknown  . Hypertension Brother   . Glaucoma Brother   . Cataracts Brother   . Healthy Daughter        x2    Past Surgical History:  Procedure Laterality Date  . None     Social History   Occupational History  . Not on file  Tobacco Use  . Smoking status: Former    Current packs/day: 0.00    Average packs/day: 0.3 packs/day for 20.0 years (5.0 ttl pk-yrs)    Types: Cigarettes    Start date: 08/12/1985    Quit date: 08/12/2005  Years since quitting: 17.5  . Smokeless tobacco: Never  Substance and Sexual Activity  . Alcohol use: Yes    Alcohol/week: 0.0 standard drinks of alcohol    Comment: Ocassionally  . Drug use: No  . Sexual activity: Not on file

## 2023-02-17 ENCOUNTER — Ambulatory Visit: Payer: BC Managed Care – PPO | Admitting: Orthopaedic Surgery

## 2023-02-17 ENCOUNTER — Other Ambulatory Visit (INDEPENDENT_AMBULATORY_CARE_PROVIDER_SITE_OTHER): Payer: BC Managed Care – PPO

## 2023-02-17 DIAGNOSIS — M25521 Pain in right elbow: Secondary | ICD-10-CM

## 2023-07-23 DIAGNOSIS — R3915 Urgency of urination: Secondary | ICD-10-CM | POA: Diagnosis not present

## 2023-09-18 DIAGNOSIS — S4991XA Unspecified injury of right shoulder and upper arm, initial encounter: Secondary | ICD-10-CM | POA: Diagnosis not present

## 2023-09-18 DIAGNOSIS — M25511 Pain in right shoulder: Secondary | ICD-10-CM | POA: Diagnosis not present

## 2023-09-18 DIAGNOSIS — M19011 Primary osteoarthritis, right shoulder: Secondary | ICD-10-CM | POA: Diagnosis not present

## 2023-10-27 DIAGNOSIS — Z113 Encounter for screening for infections with a predominantly sexual mode of transmission: Secondary | ICD-10-CM | POA: Diagnosis not present

## 2023-10-27 DIAGNOSIS — Z125 Encounter for screening for malignant neoplasm of prostate: Secondary | ICD-10-CM | POA: Diagnosis not present

## 2023-10-27 DIAGNOSIS — Z0000 Encounter for general adult medical examination without abnormal findings: Secondary | ICD-10-CM | POA: Diagnosis not present

## 2023-10-27 DIAGNOSIS — Z13220 Encounter for screening for lipoid disorders: Secondary | ICD-10-CM | POA: Diagnosis not present

## 2024-01-22 DIAGNOSIS — D122 Benign neoplasm of ascending colon: Secondary | ICD-10-CM | POA: Diagnosis not present

## 2024-01-22 DIAGNOSIS — Z83719 Family history of colon polyps, unspecified: Secondary | ICD-10-CM | POA: Diagnosis not present

## 2024-01-22 DIAGNOSIS — Z1211 Encounter for screening for malignant neoplasm of colon: Secondary | ICD-10-CM | POA: Diagnosis not present

## 2024-04-29 ENCOUNTER — Ambulatory Visit
Admission: EM | Admit: 2024-04-29 | Discharge: 2024-04-29 | Disposition: A | Discharge status: Home / Self Care | Attending: Emergency Medicine | Admitting: Emergency Medicine

## 2024-04-29 DIAGNOSIS — R0789 Other chest pain: Secondary | ICD-10-CM

## 2024-04-29 DIAGNOSIS — H6122 Impacted cerumen, left ear: Secondary | ICD-10-CM | POA: Diagnosis not present

## 2024-04-29 HISTORY — DX: Essential (primary) hypertension: I10

## 2024-04-29 NOTE — ED Triage Notes (Signed)
 Pt c/o intermittent chest pain near LT clavicle x 2 weeks. Tylenol prn. Denies injury or hx of cardiac problems.

## 2024-04-29 NOTE — ED Provider Notes (Signed)
 Kyle Jacobson    CSN: [id number] Arrival date & time: 04/29/24  1812      History   Chief Complaint Chief Complaint  Patient presents with   Chest Pain    X 2 weeks    HPI Kyle Jacobson is a 58 y.o. male.  2 weeks of intermittent left sided chest pain Comes and goes without known aggravator.  Located under the left collar bone Currently he is not having the pain. Had earlier but it went away. Was rated 2/10 pain at that time Denies any shortness of breath. No injury or trauma to the chest.   Denies cardiac history  HTN and GERD   Additionally wife states he can't hear out of his left ear  Past Medical History:  Diagnosis Date   Allergic rhinitis    Fatigue    GERD (gastroesophageal reflux disease)    Hyperlipidemia    Hypertension    Libido, decreased    Low back pain    OSA (obstructive sleep apnea)    Weight gain     Patient Active Problem List   Diagnosis Date Noted   Chest pain of uncertain etiology 06/11/2020   Essential hypertension 06/11/2020   Hyperlipidemia 06/11/2020    Past Surgical History:  Procedure Laterality Date   None         Home Medications    Prior to Admission medications   Medication Sig Start Date End Date Taking? Authorizing Provider  amLODipine (NORVASC) 5 MG tablet Take 5 mg by mouth daily. 05/15/20   [provider]  atorvastatin  (LIPITOR) 10 MG tablet Take 2 tablets (20 mg total) by mouth daily. 09/16/20   Santo Kelly A, MD  diclofenac Sodium (VOLTAREN) 1 % GEL Apply 1 application topically daily as needed. 05/15/20   [provider]  metoprolol  tartrate (LOPRESSOR ) 100 MG tablet Take one tablet by mouth 2 hours prior to CT 06/11/20   Santo Kelly LABOR, MD    Family History Family History  Problem Relation Age of Onset   Healthy Mother    Other Father        unknown   Hypertension Brother    Glaucoma Brother    Cataracts Brother    Healthy Daughter        x2     Social History Social History   Tobacco Use   Smoking status: Former    Current packs/day: 0.00    Average packs/day: 0.3 packs/day for 20.0 years (5.0 ttl pk-yrs)    Types: Cigarettes    Start date: 08/12/1985    Quit date: 08/12/2005    Years since quitting: 18.7   Smokeless tobacco: Never  Substance Use Topics   Alcohol use: Yes    Alcohol/week: 0.0 standard drinks of alcohol    Comment: Ocassionally   Drug use: No     Allergies   Patient has no known allergies.   Review of Systems Review of Systems  Cardiovascular:  Positive for chest pain.     Physical Exam Triage Vital Signs ED Triage Vitals  Encounter Vitals Group     BP 04/29/24 1845 (!) 153/95     Girls Systolic BP Percentile --      Girls Diastolic BP Percentile --      Boys Systolic BP Percentile --      Boys Diastolic BP Percentile --      Pulse Rate 04/29/24 1845 100     Resp 04/29/24 1845 17     Temp  04/29/24 1845 98.7 F (37.1 C)     Temp Source 04/29/24 1845 Oral     SpO2 04/29/24 1845 99 %     Weight --      Height --      Head Circumference --      Peak Flow --      Pain Score 04/29/24 1846 8     Pain Loc --      Pain Education --      Exclude from Growth Chart --    No data found.  Updated Vital Signs BP (!) 153/95 (BP Location: Right Arm)   Pulse 100   Temp 98.7 F (37.1 C) (Oral)   Resp 17   SpO2 99%   Physical Exam Vitals and nursing note reviewed.  Constitutional:      General: He is not in acute distress.    Appearance: Normal appearance.  HENT:     Right Ear: Ear canal normal.     Left Ear: There is impacted cerumen.     Mouth/Throat:     Pharynx: Oropharynx is clear.  Eyes:     Conjunctiva/sclera: Conjunctivae normal.     Pupils: Pupils are equal, round, and reactive to light.  Cardiovascular:     Rate and Rhythm: Normal rate and regular rhythm. No extrasystoles are present.    Pulses: Normal pulses.          Radial pulses are 2+ on the right side and 2+ on  the left side.     Heart sounds: Normal heart sounds, S1 normal and S2 normal.  Pulmonary:     Effort: Pulmonary effort is normal.     Breath sounds: Normal breath sounds.  Chest:       Comments: Pain reported in this area. Not occurring during exam. No bony tenderness of clavicle.  Abdominal:     General: There is no distension.     Palpations: Abdomen is soft.     Tenderness: There is no abdominal tenderness.  Musculoskeletal:     Cervical back: Normal range of motion.     Right lower leg: No edema.     Left lower leg: No edema.  Skin:    General: Skin is warm and dry.  Neurological:     Mental Status: He is alert and oriented to person, place, and time.     Cranial Nerves: Cranial nerves 2-12 are intact.     Sensory: Sensation is intact.     Motor: Motor function is intact.     Coordination: Coordination is intact.     Gait: Gait is intact.     UC Treatments / Results  Labs (all labs ordered are listed, but only abnormal results are displayed) Labs Reviewed - No data to display  EKG  Radiology No results found.  Procedures Procedures (including critical Jacobson time)  Medications Ordered in UC Medications - No data to display  Initial Impression / Assessment and Plan / UC Course  I have reviewed the triage vital signs and the nursing notes.  Pertinent labs & imaging results that were available during my Jacobson of the patient were reviewed by me and considered in my medical decision making (see chart for details).  EKG normal sinus, ventricular rate of 88 bpm. No ST or T wave changes.  Compared to prior reading from 10/2019 Intermittent symptoms for 2 weeks, and not occurring at this time. Discussion with patient. The pain is located just under the left collarbone. Consider musculoskeletal, advised  supportive Jacobson. Close monitoring of symptoms. Strict ED precautions are verbalized, patient states understanding.  Call primary Jacobson to make follow up  appointment.   Left ear impacted wax. Irrigated successfully. Canal clear and TM normal. Advised debrox twice weekly for prevention   Final Clinical Impressions(s) / UC Diagnoses   Final diagnoses:  Left-sided chest wall pain  Excessive ear wax, left     Discharge Instructions      Please monitor your symptoms closely. If you have worsening pain, pressure, tightness, shortness of breath or other new symptoms, go directly to the emergency department.  Call your primary Jacobson provider to make a follow up visit.   You can use Debrox ear drops twice weekly to prevent ear wax buildup     ED Prescriptions   None    PDMP not reviewed this encounter.   Abi Shoults, PA-C 04/29/24 2027

## 2024-04-29 NOTE — Discharge Instructions (Addendum)
 Please monitor your symptoms closely. If you have worsening pain, pressure, tightness, shortness of breath or other new symptoms, go directly to the emergency department.  Call your primary care provider to make a follow up visit.   You can use Debrox ear drops twice weekly to prevent ear wax buildup

## 2024-05-30 ENCOUNTER — Other Ambulatory Visit: Payer: Self-pay

## 2024-05-30 ENCOUNTER — Emergency Department (HOSPITAL_BASED_OUTPATIENT_CLINIC_OR_DEPARTMENT_OTHER)
Admission: EM | Admit: 2024-05-30 | Discharge: 2024-05-31 | Disposition: A | Discharge status: Home / Self Care | Attending: Emergency Medicine | Admitting: Emergency Medicine

## 2024-05-30 ENCOUNTER — Encounter (HOSPITAL_BASED_OUTPATIENT_CLINIC_OR_DEPARTMENT_OTHER): Payer: Self-pay

## 2024-05-30 DIAGNOSIS — N289 Disorder of kidney and ureter, unspecified: Secondary | ICD-10-CM | POA: Insufficient documentation

## 2024-05-30 DIAGNOSIS — N3001 Acute cystitis with hematuria: Secondary | ICD-10-CM | POA: Insufficient documentation

## 2024-05-30 DIAGNOSIS — I10 Essential (primary) hypertension: Secondary | ICD-10-CM | POA: Diagnosis not present

## 2024-05-30 DIAGNOSIS — R350 Frequency of micturition: Secondary | ICD-10-CM | POA: Diagnosis not present

## 2024-05-30 LAB — URINALYSIS, ROUTINE W REFLEX MICROSCOPIC
Bacteria, UA: NONE SEEN
Bilirubin Urine: NEGATIVE
Glucose, UA: NEGATIVE mg/dL
Ketones, ur: 15 mg/dL — AB
Nitrite: NEGATIVE
Protein, ur: 100 mg/dL — AB
Specific Gravity, Urine: 1.024 (ref 1.005–1.030)
Trans Epithel, UA: 4
WBC, UA: >50 WBC/hpf (ref 0–5)
pH: 6 (ref 5.0–8.0)

## 2024-05-30 LAB — CBC WITH DIFFERENTIAL/PLATELET
Abs Immature Granulocytes: 0.01 K/uL (ref 0.00–0.07)
Basophils Absolute: 0 K/uL (ref 0.0–0.1)
Basophils Relative: 0 %
Eosinophils Absolute: 0 K/uL (ref 0.0–0.5)
Eosinophils Relative: 0 %
HCT: 40.4 % (ref 39.0–52.0)
Hemoglobin: 14 g/dL (ref 13.0–17.0)
Immature Granulocytes: 0 %
Lymphocytes Relative: 8 %
Lymphs Abs: 0.6 K/uL — ABNORMAL LOW (ref 0.7–4.0)
MCH: 28.8 pg (ref 26.0–34.0)
MCHC: 34.7 g/dL (ref 30.0–36.0)
MCV: 83.1 fL (ref 80.0–100.0)
Monocytes Absolute: 0.4 K/uL (ref 0.1–1.0)
Monocytes Relative: 5 %
Neutro Abs: 6.4 K/uL (ref 1.7–7.7)
Neutrophils Relative %: 87 %
Platelets: 157 K/uL (ref 150–400)
RBC: 4.86 MIL/uL (ref 4.22–5.81)
RDW: 12.2 % (ref 11.5–15.5)
WBC: 7.4 K/uL (ref 4.0–10.5)
nRBC: 0 % (ref 0.0–0.2)

## 2024-05-30 LAB — RESP PANEL BY RT-PCR (RSV, FLU A&B, COVID)  RVPGX2
Influenza A by PCR: NEGATIVE
Influenza B by PCR: NEGATIVE
Resp Syncytial Virus by PCR: NEGATIVE
SARS Coronavirus 2 by RT PCR: NEGATIVE

## 2024-05-30 NOTE — ED Triage Notes (Signed)
 Pt has urinary urgency and frequency with pain x 2 weeks. Pt also has had congestion and headache and wants to be tested for covid.

## 2024-05-30 NOTE — ED Provider Notes (Signed)
 [facility]PART[facility] AT [facility] Provider Note   CSN: [id number] Arrival date & time: 05/30/24  2235     Patient presents with: Urinary Frequency   Kyle Jacobson is a 58 y.o. male.  {Add pertinent medical, surgical, social history, OB history to YEP:67052} The history is provided by the patient.  Patient with history of hypertension, hyperlipidemia presents for multiple complaints  Patient reports for the past 2 weeks has had urinary frequency and urgency.  He reports having very little urine output.  At times his urine appears to be orange.  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  No fevers or vomiting.  No back or abdominal pain.  Patient does have a history of nocturia, but no previous history of any kidney disease or frequent urinary tract infections Denies any recent sexual activity  Patient also reports cough and congestion for the past several days requesting COVID testing.  No chest pain or shortness of breath.  He reports mild headache   Past Medical History:  Diagnosis Date   Allergic rhinitis    Fatigue    GERD (gastroesophageal reflux disease)    Hyperlipidemia    Hypertension    Libido, decreased    Low back pain    OSA (obstructive sleep apnea)    Weight gain     Prior to Admission medications   Medication Sig Start Date End Date Taking? Authorizing Provider  amLODipine (NORVASC) 5 MG tablet Take 5 mg by mouth daily. 05/15/20   [provider]  atorvastatin  (LIPITOR) 10 MG tablet Take 2 tablets (20 mg total) by mouth daily. 09/16/20   Santo Kelly A, MD  diclofenac Sodium (VOLTAREN) 1 % GEL Apply 1 application topically daily as needed. 05/15/20   [provider]  metoprolol  tartrate (LOPRESSOR ) 100 MG tablet Take one tablet by mouth 2 hours prior to CT 06/11/20   Santo Kelly LABOR, MD    Allergies: Patient has no known allergies.     Review of Systems  HENT:  Positive for congestion.   Respiratory:  Positive for cough. Negative for shortness of breath.   Cardiovascular:  Negative for chest pain.  Genitourinary:  Positive for frequency and urgency.    Updated Vital Signs BP (!) 141/88   Pulse (!) 114   Temp 99.5 F (37.5 C) (Oral)   Resp 15   SpO2 96%   Physical Exam CONSTITUTIONAL: Well developed/well nourished HEAD: Normocephalic/atraumatic ENMT: Mucous membranes moist, uvula midline, no stridor, no exudates, no drooling CV: S1/S2 noted, no murmurs/rubs/gallops noted LUNGS: Lungs are clear to auscultation bilaterally, no apparent distress ABDOMEN: soft, nontender, no rebound or guarding, bowel sounds noted throughout abdomen GU: Exam per performed with nurse Shawn present No penile lesions or discharge, no scrotal tenderness or erythema Prostate is not enlarged and nontender, no blood or melena, no rectal mass or abscess No CVA tenderness NEURO: Pt is awake/alert/appropriate, moves all extremitiesx4.  No facial droop.   EXTREMITIES: pulses normal/equal, full ROM SKIN: warm, color normal  (all labs ordered are listed, but only abnormal results are displayed) Labs Reviewed  URINALYSIS, ROUTINE W REFLEX MICROSCOPIC - Abnormal; Notable for the following components:      Result Value   APPearance HAZY (*)    Hgb urine dipstick LARGE (*)    Ketones, ur 15 (*)    Protein, ur 100 (*)    Leukocytes,Ua LARGE (*)    All other components  within normal limits  CBC WITH DIFFERENTIAL/PLATELET - Abnormal; Notable for the following components:   Lymphs Abs 0.6 (*)    All other components within normal limits  RESP PANEL BY RT-PCR (RSV, FLU A&B, COVID)  [id number]  BASIC METABOLIC PANEL WITH GFR    EKG: None  Radiology: No results found.  {Document cardiac monitor, telemetry assessment procedure when appropriate:32947} Procedures   Medications Ordered in the ED - No data to display    {Click here for  ABCD2, HEART and other calculators REFRESH Note before signing:1}                              Medical Decision Making Amount and/or Complexity of Data Reviewed Labs: ordered.   This patient presents to the ED for concern of urinary frequency, this involves an extensive number of treatment options, and is a complaint that carries with it a high risk of complications and morbidity.  The differential diagnosis includes but is not limited to UTI, kidney stone, prostatitis, urethritis  Comorbidities that complicate the patient evaluation: Patient's presentation is complicated by their history of hypertension  Social Determinants of Health: Patient's occupation as a naval architect  increases the complexity of managing their presentation  Additional history obtained: Records reviewed Care Everywhere/External Records  Lab Tests: I Ordered, and personally interpreted labs.  The pertinent results include: UTI noted  Imaging Studies ordered: I ordered imaging studies including {imaging:26848}  I independently visualized and interpreted imaging which showed *** I agree with the radiologist interpretation  Cardiac Monitoring: The patient was maintained on a cardiac monitor.  I personally viewed and interpreted the cardiac monitor which showed an underlying rhythm of:  {cardiac monitor:26849}  Medicines ordered and prescription drug management: I ordered medication including ***  for ***  Reevaluation of the patient after these medicines showed that the patient    {resolved/improved/worsened:23923::improved}  Test Considered: Patient is low risk / negative by ***, therefore do not feel that *** is indicated.  Critical Interventions:  ***  Consultations Obtained: I requested consultation with the {consultation:26851}, and discussed  findings as well as pertinent plan - they recommend: ***  Reevaluation: After the interventions noted above, I reevaluated the patient and found that they  have :{resolved/improved/worsened:23923::improved}  Complexity of problems addressed: Patient's presentation is most consistent with  {RNEJ:73156}  Disposition: After consideration of the diagnostic results and the patient's response to treatment,  I feel that the patent would benefit from {disposition:26850}.     {Document critical care time when appropriate  Document review of labs and clinical decision tools ie CHADS2VASC2, etc  Document your independent review of radiology images and any outside records  Document your discussion with family members, caretakers and with consultants  Document social determinants of health affecting pt's care  Document your decision making why or why not admission, treatments were needed:32947:::1}   Final diagnoses:  None    ED Discharge Orders     None

## 2024-05-31 LAB — BASIC METABOLIC PANEL WITH GFR
Anion gap: 11 (ref 5–15)
BUN: 15 mg/dL (ref 6–20)
CO2: 27 mmol/L (ref 22–32)
Calcium: 9.1 mg/dL (ref 8.9–10.3)
Chloride: 95 mmol/L — ABNORMAL LOW (ref 98–111)
Creatinine, Ser: 1.66 mg/dL — ABNORMAL HIGH (ref 0.61–1.24)
GFR, Estimated: 47 mL/min — ABNORMAL LOW (ref >60)
Glucose, Bld: 95 mg/dL (ref 70–99)
Potassium: 3.6 mmol/L (ref 3.5–5.1)
Sodium: 133 mmol/L — ABNORMAL LOW (ref 135–145)

## 2024-05-31 MED ORDER — CEPHALEXIN 500 MG PO CAPS: 500.0000 mg | ORAL_CAPSULE | Freq: Three times a day (TID) | ORAL | 0 refills | Status: AC

## 2024-05-31 NOTE — Discharge Instructions (Signed)
 Be sure to increase your water intake.  Please see Dr. Swayne in 2 to 3 weeks for recheck of your kidney function.  Your creatinine is 1.6 today  please avoid ibuprofen/Advil/Motrin/Aleve/naproxen medications

## 2024-06-24 DIAGNOSIS — L309 Dermatitis, unspecified: Secondary | ICD-10-CM | POA: Diagnosis not present
